# Patient Record
Sex: Female | Born: 1960
Health system: Southern US, Community
[De-identification: ages and names within clinical notes are randomized; demographics above are authoritative.]

## PROBLEM LIST (undated history)

## (undated) DIAGNOSIS — Z800 Family history of malignant neoplasm of digestive organs: Secondary | ICD-10-CM

## (undated) DIAGNOSIS — Z8041 Family history of malignant neoplasm of ovary: Secondary | ICD-10-CM

## (undated) DIAGNOSIS — J449 Chronic obstructive pulmonary disease, unspecified: Secondary | ICD-10-CM

## (undated) DIAGNOSIS — I890 Lymphedema, not elsewhere classified: Secondary | ICD-10-CM

## (undated) DIAGNOSIS — C50912 Malignant neoplasm of unspecified site of left female breast: Secondary | ICD-10-CM

## (undated) DIAGNOSIS — Z803 Family history of malignant neoplasm of breast: Secondary | ICD-10-CM

## (undated) DIAGNOSIS — E7800 Pure hypercholesterolemia, unspecified: Secondary | ICD-10-CM

## (undated) DIAGNOSIS — Z90722 Acquired absence of ovaries, bilateral: Secondary | ICD-10-CM

## (undated) DIAGNOSIS — M1990 Unspecified osteoarthritis, unspecified site: Secondary | ICD-10-CM

## (undated) DIAGNOSIS — Z853 Personal history of malignant neoplasm of breast: Secondary | ICD-10-CM

## (undated) DIAGNOSIS — C50919 Malignant neoplasm of unspecified site of unspecified female breast: Secondary | ICD-10-CM

## (undated) DIAGNOSIS — Z1509 Genetic susceptibility to other malignant neoplasm: Secondary | ICD-10-CM

## (undated) DIAGNOSIS — D649 Anemia, unspecified: Secondary | ICD-10-CM

## (undated) DIAGNOSIS — C50911 Malignant neoplasm of unspecified site of right female breast: Secondary | ICD-10-CM

## (undated) DIAGNOSIS — Z1501 Genetic susceptibility to malignant neoplasm of breast: Secondary | ICD-10-CM

## (undated) HISTORY — DX: Unspecified osteoarthritis, unspecified site: M19.90

## (undated) HISTORY — PX: ABDOMINAL HYSTERECTOMY: SHX81

## (undated) HISTORY — PX: BREAST SURGERY: SHX581

## (undated) HISTORY — DX: Acquired absence of ovaries, bilateral: Z90.722

## (undated) HISTORY — PX: MASTECTOMY: SHX3

## (undated) HISTORY — DX: Personal history of malignant neoplasm of breast: Z85.3

## (undated) HISTORY — DX: Family history of malignant neoplasm of breast: Z80.3

## (undated) HISTORY — DX: Malignant neoplasm of unspecified site of unspecified female breast: C50.919

## (undated) HISTORY — DX: Malignant neoplasm of unspecified site of left female breast: C50.912

## (undated) HISTORY — DX: Genetic susceptibility to other malignant neoplasm: Z15.09

## (undated) HISTORY — DX: Family history of malignant neoplasm of digestive organs: Z80.0

## (undated) HISTORY — DX: Malignant neoplasm of unspecified site of right female breast: C50.911

## (undated) HISTORY — DX: Anemia, unspecified: D64.9

## (undated) HISTORY — DX: Genetic susceptibility to malignant neoplasm of breast: Z15.01

## (undated) HISTORY — DX: Chronic obstructive pulmonary disease, unspecified: J44.9

## (undated) HISTORY — DX: Family history of malignant neoplasm of ovary: Z80.41

## (undated) HISTORY — PX: OTHER SURGICAL HISTORY: SHX169

---

## 2000-06-21 ENCOUNTER — Encounter: Admission: RE | Admit: 2000-06-21 | Discharge: 2000-09-19 | Payer: Self-pay | Admitting: *Deleted

## 2001-03-30 ENCOUNTER — Encounter: Admission: RE | Admit: 2001-03-30 | Discharge: 2001-03-30 | Payer: Self-pay | Admitting: Oncology

## 2001-03-30 ENCOUNTER — Encounter (HOSPITAL_COMMUNITY): Admission: RE | Admit: 2001-03-30 | Discharge: 2001-04-29 | Payer: Self-pay | Admitting: Oncology

## 2001-06-15 ENCOUNTER — Encounter: Admission: RE | Admit: 2001-06-15 | Discharge: 2001-06-15 | Payer: Self-pay | Admitting: Oncology

## 2001-06-15 ENCOUNTER — Encounter (HOSPITAL_COMMUNITY): Admission: RE | Admit: 2001-06-15 | Discharge: 2001-07-15 | Payer: Self-pay | Admitting: Oncology

## 2001-08-10 ENCOUNTER — Encounter (HOSPITAL_COMMUNITY): Admission: RE | Admit: 2001-08-10 | Discharge: 2001-09-09 | Payer: Self-pay | Admitting: Oncology

## 2001-08-10 ENCOUNTER — Encounter (HOSPITAL_COMMUNITY): Payer: Self-pay | Admitting: Oncology

## 2001-08-10 ENCOUNTER — Encounter: Admission: RE | Admit: 2001-08-10 | Discharge: 2001-08-10 | Payer: Self-pay | Admitting: Oncology

## 2001-12-12 ENCOUNTER — Encounter: Admission: RE | Admit: 2001-12-12 | Discharge: 2001-12-12 | Payer: Self-pay | Admitting: Oncology

## 2001-12-12 ENCOUNTER — Encounter (HOSPITAL_COMMUNITY): Admission: RE | Admit: 2001-12-12 | Discharge: 2002-01-11 | Payer: Self-pay | Admitting: Oncology

## 2002-04-12 ENCOUNTER — Encounter (HOSPITAL_COMMUNITY): Admission: RE | Admit: 2002-04-12 | Discharge: 2002-05-12 | Payer: Self-pay | Admitting: Oncology

## 2002-04-12 ENCOUNTER — Encounter: Admission: RE | Admit: 2002-04-12 | Discharge: 2002-04-12 | Payer: Self-pay | Admitting: Oncology

## 2003-04-25 ENCOUNTER — Encounter: Admission: RE | Admit: 2003-04-25 | Discharge: 2003-04-25 | Payer: Self-pay | Admitting: Oncology

## 2003-04-25 ENCOUNTER — Encounter (HOSPITAL_COMMUNITY): Admission: RE | Admit: 2003-04-25 | Discharge: 2003-05-25 | Payer: Self-pay | Admitting: Oncology

## 2004-04-23 ENCOUNTER — Encounter: Admission: RE | Admit: 2004-04-23 | Discharge: 2004-04-23 | Payer: Self-pay | Admitting: Oncology

## 2004-08-08 ENCOUNTER — Inpatient Hospital Stay (HOSPITAL_COMMUNITY): Admission: EM | Admit: 2004-08-08 | Discharge: 2004-08-11 | Payer: Self-pay | Admitting: Emergency Medicine

## 2005-04-20 ENCOUNTER — Encounter: Admission: RE | Admit: 2005-04-20 | Discharge: 2005-04-20 | Payer: Self-pay | Admitting: Oncology

## 2005-04-20 ENCOUNTER — Ambulatory Visit (HOSPITAL_COMMUNITY): Payer: Self-pay | Admitting: Oncology

## 2006-04-19 ENCOUNTER — Ambulatory Visit (HOSPITAL_COMMUNITY): Payer: Self-pay | Admitting: Oncology

## 2006-04-19 ENCOUNTER — Encounter: Admission: RE | Admit: 2006-04-19 | Discharge: 2006-04-19 | Payer: Self-pay | Admitting: Oncology

## 2007-01-02 ENCOUNTER — Inpatient Hospital Stay (HOSPITAL_COMMUNITY): Admission: EM | Admit: 2007-01-02 | Discharge: 2007-01-05 | Payer: Self-pay | Admitting: Emergency Medicine

## 2007-04-16 ENCOUNTER — Encounter (HOSPITAL_COMMUNITY): Admission: RE | Admit: 2007-04-16 | Discharge: 2007-05-16 | Payer: Self-pay | Admitting: Oncology

## 2007-04-16 ENCOUNTER — Ambulatory Visit (HOSPITAL_COMMUNITY): Payer: Self-pay | Admitting: Oncology

## 2008-05-20 ENCOUNTER — Ambulatory Visit (HOSPITAL_COMMUNITY): Payer: Self-pay | Admitting: Oncology

## 2008-07-28 ENCOUNTER — Other Ambulatory Visit: Admission: RE | Admit: 2008-07-28 | Discharge: 2008-07-28 | Payer: Self-pay | Admitting: Obstetrics and Gynecology

## 2008-08-01 ENCOUNTER — Ambulatory Visit (HOSPITAL_COMMUNITY): Admission: RE | Admit: 2008-08-01 | Discharge: 2008-08-01 | Payer: Self-pay | Admitting: Obstetrics & Gynecology

## 2009-06-10 ENCOUNTER — Ambulatory Visit (HOSPITAL_COMMUNITY): Payer: Self-pay | Admitting: Oncology

## 2009-06-10 ENCOUNTER — Encounter (HOSPITAL_COMMUNITY): Admission: RE | Admit: 2009-06-10 | Discharge: 2009-07-10 | Payer: Self-pay | Admitting: Oncology

## 2009-06-30 ENCOUNTER — Inpatient Hospital Stay (HOSPITAL_COMMUNITY): Admission: RE | Admit: 2009-06-30 | Discharge: 2009-07-02 | Payer: Self-pay | Admitting: Obstetrics and Gynecology

## 2009-06-30 ENCOUNTER — Encounter: Payer: Self-pay | Admitting: Obstetrics and Gynecology

## 2010-04-07 ENCOUNTER — Ambulatory Visit (HOSPITAL_COMMUNITY)
Admission: RE | Admit: 2010-04-07 | Discharge: 2010-04-07 | Payer: Self-pay | Source: Home / Self Care | Admitting: Family Medicine

## 2010-04-23 ENCOUNTER — Ambulatory Visit (HOSPITAL_COMMUNITY): Payer: Self-pay | Admitting: Oncology

## 2010-11-01 ENCOUNTER — Ambulatory Visit: Payer: Self-pay | Admitting: Internal Medicine

## 2010-12-01 ENCOUNTER — Ambulatory Visit: Admit: 2010-12-01 | Payer: Self-pay | Admitting: Internal Medicine

## 2010-12-01 ENCOUNTER — Ambulatory Visit (HOSPITAL_COMMUNITY)
Admission: RE | Admit: 2010-12-01 | Discharge: 2010-12-01 | Payer: Self-pay | Source: Home / Self Care | Attending: Internal Medicine | Admitting: Internal Medicine

## 2010-12-01 LAB — HEMOGLOBIN AND HEMATOCRIT, BLOOD
HCT: 33.9 % — ABNORMAL LOW (ref 36.0–46.0)
Hemoglobin: 11.4 g/dL — ABNORMAL LOW (ref 12.0–15.0)

## 2010-12-02 LAB — H. PYLORI ANTIBODY, IGG: H Pylori IgG: >8 {ISR} — ABNORMAL HIGH

## 2011-03-05 LAB — DIFFERENTIAL
Basophils Absolute: 0.1 10*3/uL (ref 0.0–0.1)
Basophils Relative: 1 % (ref 0–1)
Eosinophils Absolute: 0 10*3/uL (ref 0.0–0.7)
Eosinophils Relative: 0 % (ref 0–5)
Lymphocytes Relative: 20 % (ref 12–46)
Lymphs Abs: 2 10*3/uL (ref 0.7–4.0)
Monocytes Absolute: 0.3 10*3/uL (ref 0.1–1.0)
Monocytes Relative: 3 % (ref 3–12)
Neutro Abs: 8 10*3/uL — ABNORMAL HIGH (ref 1.7–7.7)
Neutrophils Relative %: 77 % (ref 43–77)

## 2011-03-05 LAB — CBC
HCT: 35.2 % — ABNORMAL LOW (ref 36.0–46.0)
Hemoglobin: 11.6 g/dL — ABNORMAL LOW (ref 12.0–15.0)
MCHC: 33.1 g/dL (ref 30.0–36.0)
MCV: 78.9 fL (ref 78.0–100.0)
Platelets: 215 10*3/uL (ref 150–400)
RBC: 4.46 MIL/uL (ref 3.87–5.11)
RDW: 15.6 % — ABNORMAL HIGH (ref 11.5–15.5)
WBC: 10.4 10*3/uL (ref 4.0–10.5)

## 2011-03-06 LAB — CA 125: CA 125: 18.4 U/mL (ref 0.0–30.2)

## 2011-03-06 LAB — CBC
HCT: 37.8 % (ref 36.0–46.0)
Hemoglobin: 12.7 g/dL (ref 12.0–15.0)
MCHC: 33.5 g/dL (ref 30.0–36.0)
MCV: 78.6 fL (ref 78.0–100.0)
Platelets: 245 10*3/uL (ref 150–400)
RBC: 4.81 MIL/uL (ref 3.87–5.11)
RDW: 15.7 % — ABNORMAL HIGH (ref 11.5–15.5)
WBC: 3.6 10*3/uL — ABNORMAL LOW (ref 4.0–10.5)

## 2011-03-06 LAB — COMPREHENSIVE METABOLIC PANEL
ALT: 21 U/L (ref 0–35)
AST: 22 U/L (ref 0–37)
Albumin: 3.7 g/dL (ref 3.5–5.2)
Alkaline Phosphatase: 35 U/L — ABNORMAL LOW (ref 39–117)
BUN: 7 mg/dL (ref 6–23)
CO2: 28 mEq/L (ref 19–32)
Calcium: 9.7 mg/dL (ref 8.4–10.5)
Chloride: 104 mEq/L (ref 96–112)
Creatinine, Ser: 0.76 mg/dL (ref 0.4–1.2)
GFR calc Af Amer: >60 mL/min (ref >60)
GFR calc non Af Amer: >60 mL/min (ref >60)
Glucose, Bld: 100 mg/dL — ABNORMAL HIGH (ref 70–99)
Potassium: 4.2 mEq/L (ref 3.5–5.1)
Sodium: 138 mEq/L (ref 135–145)
Total Bilirubin: 0.8 mg/dL (ref 0.3–1.2)
Total Protein: 7.2 g/dL (ref 6.0–8.3)

## 2011-03-06 LAB — TYPE AND SCREEN
ABO/RH(D): B POS
Antibody Screen: NEGATIVE

## 2011-03-06 LAB — HCG, SERUM, QUALITATIVE: Preg, Serum: NEGATIVE

## 2011-04-12 NOTE — Discharge Summary (Signed)
NAMEANGELISA, [name]                ACCOUNT NO.:  [id number]   MEDICAL RECORD NO.:  [id number]          PATIENT TYPE:  INP   LOCATION:  A319                          FACILITY:  [facility]   PHYSICIAN:  [name], M.D. DATE OF BIRTH:  11-20-1961   DATE OF ADMISSION:  06/30/2009  DATE OF DISCHARGE:  08/05/2010LH                               DISCHARGE SUMMARY   ADMITTING DIAGNOSES:  1. Uterine fibroids, 14 weeks' size.  2. Breast cancer, increased risk, suspected BRCA1 carrier.  3. Asthma stable.   DISCHARGE DIAGNOSES:  1. Uterine fibroids, 14 weeks' size.  2. Breast cancer, increased risk, suspected BRCA1 carrier.  3. Adenomyosis.  4. Asthma stable.   PROCEDURE:  Total abdominal hysterectomy and bilateral salpingo-  oophorectomy.   DISCHARGE MEDICATIONS:  Current medicines continued:  1. Advair 1 puff twice daily.  2. ProAir 2 puffs p.o. daily.  3. Tylenol 325 mg, 2 tabs p.o. q.4 h. p.r.n. pain.  4. Multivitamin 1 p.o. daily.   Additional medications:  1. Percocet 5/325, 30 tablets 1 p.o. q.6 h. p.r.n. severe pain.  2. Motrin 600 mg, 30, 1 p.o. q.6 h. p.r.n. mild pain and discomfort.  3. Colace 100 mg p.o. b.i.d.   HOSPITAL SUMMARY:  This 49 year old female referred by Dr. Mariel Estrada,  her oncologist, for pelvic mass identified during routine visit, was  referred to our office due to the mass effect and for removal of ovaries  to reduce ovarian cancer risk, given which she has an extensive history  of personal breast cancer as well as family history.  The patient  underwent admission with hemoglobin 12.7, hematocrit 37.8, BUN 7, and  creatinine 0.6.  Blood type A positive.  She underwent total abdominal  hysterectomy and bilateral salpingo-oophorectomy via  Pfannenstiel  incision on day of admission with 300 mL estimated blood loss.  Pathology report returned showed a 14.5 x 11 x 8.5 cm uterine body and  5.5 cm long cervix, total weight of 626 g.  Pathology report showed  benign ovaries, uterine fibroids, and adenomyosis.  Also, the pathology  showed benign scar tissue where the old cicatrix was removed.  Postop  course was uneventful with the patient having postoperative hemoglobin  11.6 and hematocrit 35.2.  She had normal blood pressures in the 130  systolic immediately postop day #1 and then reducing to 80-90 systolic  on day #2  was seen, management was optimized.  She had an uneventful postop course  and discharged postop day #2 for followup in 5 days for incision check.  Routine postsurgical instructions given including monitoring  temperature, resumption of stool softener x30 days, and follow up p.r.n.  at our office.      [name], M.D.  Electronically Signed     JVF/MEDQ  D:  07/02/2009  T:  07/02/2009  Job:  [id number]   cc:   [name]. Kellie Sleet, MD  Fax: [phone]

## 2011-04-12 NOTE — H&P (Signed)
[name], [name]                ACCOUNT NO.:  [id number]   MEDICAL RECORD NO.:  [id number]          PATIENT TYPE:  AMB   LOCATION:  DAY                           FACILITY:  [facility]   PHYSICIAN:  [name], M.D. DATE OF BIRTH:  February 07, 1961   DATE OF ADMISSION:  06/30/2009  DATE OF DISCHARGE:  LH                              HISTORY & PHYSICAL   ADMISSION DIAGNOSIS:  1. Uterine fibroids, 16-18 weeks' size.  2. Probable breast cancer antigen 1 carrier.  3. History of bilateral breast cancers, status post chemotherapy.  4. Bilateral lymphedema.   HISTORY OF PRESENT ILLNESS:  This 50 year old female is admitted for TAH-  BSO probably, and may require midline vertical incision for this  procedure.  She has been seen in our office and it has been noted to  have uterine enlargement with fibroids, estimated approximately [phone]  g uterus.  The ovaries are grossly normal.  She had a history bilateral  breast cancer and has an extensive family history of it.  She has been  recommended to have bilateral oophorectomy by her oncologist, Dr.  Mariel Sleet.  Plans are to proceed with abdominal hysterectomy and  bilateral salpingo-oophorectomy.  Recent Pap smear is performed and was  normal.  GC and chlamydia culture was negative.  Endometrial biopsy has  been performed, which shows secretory endometrium with chronic  endometriosis and focal stromal breakdown she was at the end of it.  There was no endometrial hyperplasia, atypia, or malignancy.   PAST MEDICAL HISTORY:   ALLERGIES:  None.   MEDICATIONS:  Asthma inhaler p.r.n. and occasional Advil.   PERSONAL HISTORY:  Positive for breast cancer, asthma, hypertension, and  cervical disk changes, bilateral lymphedema, and status post mastectomy  and treatments including radiation therapy.   SURGICAL HISTORY:  Right partial mastectomy in 1992, left partial  mastectomy in October 2001.   SOCIAL HABITS:  Nonsmoker and nondrinker.  No drug.   Homemaker.   PHYSICAL EXAMINATION:  VITAL SIGNS:  Height 5 and 1-1/2 inches, weight  179.4, blood pressure 120/80, and pulse is 80.  Urinalysis is negative.  GENERAL:  A healthy-appearing large framed African American female alert  and oriented x3.  HEENT:  Pupils are equal, round, and reactive.  NECK:  Supple.  Normal thyroid.  BREASTS:  Deferred at this time with recent evaluation by Dr. Mariel Sleet.  CARDIAC:  Regular rate and rhythm without murmurs, rubs, or gallops.  ABDOMEN:  Nontender without masses.  EXTREMITIES:  Bilateral lymphedema, IV access was considered to be a  challenge.  She has had a subclavian line in the past, which has been  taken out.  EXTERNAL GENITALIA:  Normal cervix.  Vaginal exam normal.  Cervical exam  shows purulent normal tissues.  Pap smear was normal on July 07 2008.  Adnexa nontender.  A 16-18 week size uterus with mobile and good support  with lower abdomen incision transverse due to prior cesarean section.   PLAN:  Abdominal hysterectomy and bilateral salpingo-oophorectomy on  June 30, 2009 7:30 a.m. at [facility].  IV antibiotic  prophylaxis and  Flowtron leg massage and antiembolic prophylaxis  planned.      [name], M.D.  Electronically Signed     JVF/MEDQ  D:  06/29/2009  T:  06/30/2009  Job:  [id number]

## 2011-04-12 NOTE — Op Note (Signed)
[name], [name]                ACCOUNT NO.:  [id number]   MEDICAL RECORD NO.:  [id number]          PATIENT TYPE:  INP   LOCATION:  A319                          FACILITY:  [facility]   PHYSICIAN:  [name], M.D. DATE OF BIRTH:  [birth year]   DATE OF PROCEDURE:  06/30/2009  DATE OF DISCHARGE:                               OPERATIVE REPORT   PREOPERATIVE DIAGNOSIS:  Uterine fibroids 14-16 weeks size.   POSTOPERATIVE DIAGNOSES:  Uterine fibroids 14-16 weeks size, increased  risk of breast cancer, suspected the breast cancer antigen 1 carrier   PROCEDURE:  Total abdominal hysterectomy, bilateral salpingo-  oophorectomy.   SURGEON:  Kellie Burrow, MD   ASSISTANT:  [name], registered nurse.   ANESTHESIA:  General, [name]   COMPLICATIONS:  None.   FINDINGS:  Multiple fibroids, short right round ligament, multiple  varicosities bilaterally, normal-appearing ovaries bilaterally.   INDICATIONS:  A 50 year old female referred for pelvic mass found to  have a large fibroids which were source of discomfort.  Additionally,  the patient has had bilateral breast cancer and extensive family history  of breast cancer which I suspected to have the BRCA 1 carrier status.   DETAILS OF PROCEDURE:  The patient was taken to the operating room,  prepped and draped for lower abdominal surgery.  IV access was through  in superficial vein in the right shoulder region due to bilateral  lymphedema on the upper extremities.  Status post prior to chemotherapy.  Time-out was conducted.  Procedure confirmed by all parties involved in  antibiotics administration prior to incision confirmed.  Pfannenstiel  type-incision was repeated with excision of the old cicatrix through the  skin and subcu tissues.  Fascia was opened transversely and elevated off  the underlying muscle with some difficulty due to fibrosis and the  peritoneal cavity carefully entered in the midline and opened cephalad  and  caudad.  Bowel was elevated out of the pelvis and a flexible  retractor put in place.  Uterus was mobile sufficiently to allow access  to the round ligaments for leaving the uterus in situ.  Round ligaments  were taken down bilaterally.  On the left side, the access to the  infundibulopelvic ligament was technically challenging so the first  portion of the procedure was taken down the left utero-ovarian ligament  and associated connective tissue with double clamping with Kelly clamps  transection and 0 chromic suture ligature.  On the right side, the round  ligament was taken down and subsequently the right infundibulopelvic  ligament could be identified and isolated, clamped, cut, and suture-  ligated using Kelly clamps and 0 chromic suture ligature.  I stayed  close to the ovary and therefore were able to avoid the rest of the  ureter on this side.  The uterine vessels were then isolated on either  side.  The bladder flap was developed with some technical difficulty due  to rather dense adhesions to the lower uterine segment site of the prior  cesarean sections.  We carefully were able to tunnel beneath the  fibrosis and dissect the  vesicouterine space and then peel the edge of  the bladder flap off the lower uterine segment without further  difficulty.   Uterine vessels were then isolated on either side, clamped with curved  Heaney clamps x2, and a Kelly clamp for backbleeding transected and  suture-ligated.  At this time, the uterine body was amputated off the  lower uterine segment and sent off as specimen.  The improved access  allow the grasping of the lower uterine segment.  We marched down the  sides of the lower uterine segment clamping and cutting and suture  ligating with straight Heaney clamps, knife dissection, and 0 chromic  suture ligature.  Bladder flap peeled down nicely.  Upon reaching the  level with the cervix could be palpated through the upper vagina, a  single  stab incision was made into the anterior cervicovaginal fornix  and the cervix amputated carefully off the vaginal cuff.  Four Kocher  clamps were used to grasp the vaginal cuff edges and Aldridge stitches  were placed at each lateral vaginal angle.  This allowed good vaginal  cuff support.  The remainder of the cuff was closed with a series of  interrupted sutures of 0 chromic.  Hemostasis was satisfactory.  Point  cautery was required on the posterior aspect of the cuff x2 sites.  Irrigation of the pelvis was performed.   The left tube and ovary was then grasped, placed on traction of the  retroperitoneum, and dissected sufficiently to palpate the left ureter  as being well away from the area of surgical endeavor and the left IP  ligament was clamped, cut, and suture-ligated without difficulty with  good hemostasis.  Pelvis was further irrigated and inspected.  Adequate  hemostasis considered at present.  Laparotomy equipment was removed, and  EBL estimated 300 mL.  Sponge and needle counts were correct.  The  anterior peritoneum was closed with running 2-0 chromic and the fascia  then closed with running 0 Vicryl beginning at each side and sewing to  the midline, 2 separate portions of suture.  Subcu tissues were  irrigated and inspected.  Point cautery used as necessary to confirm  hemostasis and then interrupted 2-0 plain sutures placed in the subcu  space to obliterate the potential space and staple closure of the skin  completed the procedure.  Sponge and needle counts were correct.   ESTIMATED BLOOD LOSS:  300 mL.      [name], M.D.  Electronically Signed     JVF/MEDQ  D:  06/30/2009  T:  06/30/2009  Job:  [id number]   cc:   [name]. Kellie Sleet, MD  Fax: [phone]

## 2011-04-12 NOTE — H&P (Signed)
NAME:  [name], [name]                ACCOUNT NO.:  [id number]   MEDICAL RECORD NO.:  [id number]          PATIENT TYPE:  AMB   LOCATION:  DAY                           FACILITY:  [facility]   PHYSICIAN:  [name], M.D. DATE OF BIRTH:  [birth year]   DATE OF ADMISSION:  DATE OF DISCHARGE:  LH                              HISTORY & PHYSICAL   ADMISSION DIAGNOSES:  1. Uterine fibroids 16-18 weeks' size.  2. Breast cancer gene 1, carrier status (probable).  3. History of bilateral breast cancer.  4. Bilateral lymphedema status post axillary dissection bilaterally      and radiation therapy bilaterally.   HISTORY OF PRESENT ILLNESS:  This 50 year old perimenopausal female  still having menses on a regular basis is seeing at this time for total  abdominal hysterectomy and bilateral salpingo-oophorectomy due to  symptomatic pressure from the large uterine fibroids measuring 16-18  weeks' size as well as bilateral salpingo-oophorectomy due to increased  risk of ovarian and/or recurrent breast cancer due to presumed BRCA1  carrier status.  The patient's mother is known as BRCA1 carrier and the  patient has had bilateral breast cancers, both in 1992 on the right, and  in 2001 on the left.  She has been advised by Dr. Mariel Sleet, her medical  oncologist to proceed with hysterectomy and removal of ovaries and at  this time, has decided to proceed with this due to symptoms of the  enlarged uterine fibroids.  She has had an ultrasound at [facility] confirming that it is uterine fibroid.  She has had a 1.5-cm  thickened endometrial complex on June 10, 2009, and is due.  Plans are  to proceed toward hysterectomy.  She has had a normal Pap smear within  the past year.   PAST MEDICAL HISTORY:  Notable for no allergies known.   MEDICATIONS:  Inhaler for asthma, occasional Advil.   PERSONAL HISTORY:  Positive for bilateral breast cancer, essential  hypertension, cervical disk changes, and  bilateral lymphedema in both  arms status post mastectomy, lymph node sampling, and radiation therapy.   SURGICAL HISTORY:  Right mastectomy, 1992; left mastectomy, 2001;  cesarean section x2, 1990 and subsequently.   PHYSICAL EXAMINATION:  GENERAL:  Healthy-appearing African American  female, moderately overweight with obvious lymphedema of both upper  extremities.  HEENT:  Pupils are equal, round, and reactive.  Extraocular movements  intact.  NECK:  Supple.  BREASTS:  Deferred.  Recent exam by Dr. Mariel Sleet this month.  CARDIAC:  Regular rate and rhythm without murmurs.  ABDOMEN:  Mass noted just below the umbilicus 16-18 weeks' size with  well-healed Pfannenstiel-type incision.  MUSCULOSKELETAL:  Normal.  EXTREMITIES:  Notable for the bilateral lymphedema.  She states that IV  access is difficult.  She has had a subclavian PICC line, which has been  removed in the past, both placed and removed by Dr. Elpidio Anis.  GU:  External genitalia is normal.  Vaginal exam visibly normal.  Cervix  is normal.  Pap smears performed, June 15, 2009.  Uterus is anteflexed,  16-18 weeks' size,  mobile, elongated with good pelvic support.   IMPRESSION:  Uterine fibroids 16-18 weeks' size, mobile pelvis.   PLAN:  Total abdominal hysterectomy, bilateral salpingo-oophorectomy,  June 30, 2009, 7:30 a.m.      [name], M.D.  Electronically Signed     JVF/MEDQ  D:  06/22/2009  T:  06/23/2009  Job:  [id number]   cc:   [facility]

## 2011-04-15 NOTE — H&P (Signed)
Kellie, Estrada                ACCOUNT NO.:  [id number]   MEDICAL RECORD NO.:  [id number]          PATIENT TYPE:  INP   LOCATION:  A325                          FACILITY:  [facility]   PHYSICIAN:  Jerolyn Shin C. Katrinka Blazing, M.D.   DATE OF BIRTH:  1961-02-20   DATE OF ADMISSION:  08/08/2004  DATE OF DISCHARGE:  09/14/2005LH                                HISTORY & PHYSICAL   HISTORY:  A 50 year old female with a history of chronic lymphedema, who has  a three to four-day history of progressive swelling of her left arm.  She  tried warm compresses and elevation at home, but the swelling did not get  better.  She came to the emergency room for evaluation where she was found  to have extensive cellulitis of her left arm.  With the history of severe  lymphedema, the patient was admitted and will undergo IV antibiotics and  local wound care.   PAST HISTORY:  1.  She has a history of bilateral breast cancer and is status post      bilateral modified radical mastectomy with axillary node dissection.  2.  She  has had a chronic lymphedema for at least four years.  3.  She also has bronchial asthma.   MEDICATIONS:  1.  Advair inhaler twice daily.  2.  Albuterol nebulizer q.i.d. p.r.n.   ALLERGIES:  She has no known drug allergies.   SOCIAL HISTORY:  She is medically retired.  She lives with her husband and  three children.  She does not drink, smoke or use drugs.   FAMILY PHYSICIAN:  Mila Homer. [name], M.D.   REVIEW OF SYSTEMS:  Unremarkable, except for intermittent shortness of  breath, chest tightness and chronic swelling of her arms.   PHYSICAL EXAMINATION:  GENERAL APPEARANCE:  The patient is in no acute  distress.  VITAL SIGNS:  Her blood pressure is 110/70, pulse 80, respirations 18 and  temperature 98.4 degrees.  HEENT:  Unremarkable.  NECK:  Supple.  No JVD or bruit.  LUNGS:  Clear to auscultation.  No rales, rubs, rhonchi or wheezes.  BREASTS:  Surgically absent bilaterally.  CHEST:  The chest wall is unremarkable.  Surgical scars are healed.  AXILLAE:  Unremarkable.  There is no palpable lymphadenopathy.  HEART:  Regular rate and rhythm without murmur, rub or gallop.  ABDOMEN:  Obese, soft and nontender.  No masses.  EXTREMITIES:  Diffuse swelling of both extremities, including hands and  fingers up to shoulders.  There is diffuse swelling and redness of the left  hand with tenderness only with palpation.  The erythema blanches with  palpation.  There is no evidence of desquamation of the skin.  The lower  extremities are unremarkable.  NEUROLOGIC:  No focal, motor, sensory or cerebellar deficits.   IMPRESSION:  1.  Cellulitis of the left arm with a history of chronic lymphedema.  2.  Bilateral lymphedema.  3.  History of bilateral breast cancer, status post modified radical      mastectomy with node dissection and status post chemotherapy.  4.  Chronic bronchial asthma,  stable.   PLAN:  The patient was admitted.  She will be treated with IV antibiotics,  local wound care and elevation.  Baseline laboratories will be drawn.     Lero   LCS/MEDQ  D:  10/17/2004  T:  10/17/2004  Job:  [id number]

## 2011-04-15 NOTE — Group Therapy Note (Signed)
NAMEDARBIE, BIANCARDI                ACCOUNT NO.:  [id number]   MEDICAL RECORD NO.:  [id number]          PATIENT TYPE:  INP   LOCATION:  A307                          FACILITY:  [facility]   PHYSICIAN:  Mila Homer. [name], M.D.DATE OF BIRTH:  1961/05/22   DATE OF PROCEDURE:  DATE OF DISCHARGE:                                 PROGRESS NOTE   Says she feels better.  Still has some swelling and tenderness in the  arm but is improved since yesterday.   OBJECTIVE:  Temperature is 98.0, pulse 77, respiratory rate 20, blood  pressure 100/73.  She is sitting up bed.  She is oriented, alert and in no acute distress.  Well-developed, well-nourished.  She has got bilateral lymphedema of both arms but she is able to flex  and extend the right arm better than yesterday and seems to have less  pain with it.  O2 sat is 97% room air.   ASSESSMENT:  1. Cellulitis of the right arm.  2. Bilateral lymphedema.  3. Status post bilateral breast cancer first in 1992, the second in      2000 with bilateral modified radical mastectomies.   PLAN:  Continue IV antibiotics.  Discontinue  IVs, switch to Hep-Lock.  Hopefully go home tomorrow.      Mila Homer. [name], M.D.  Electronically Signed     SDK/MEDQ  D:  01/04/2007  T:  01/04/2007  Job:  [id number]

## 2011-04-15 NOTE — Discharge Summary (Signed)
NAMEQUEENIE, AUFIERO                ACCOUNT NO.:  [id number]   MEDICAL RECORD NO.:  [id number]          PATIENT TYPE:  INP   LOCATION:  A325                          FACILITY:  [facility]   PHYSICIAN:  Jerolyn Shin C. Katrinka Blazing, M.D.   DATE OF BIRTH:  Feb 21, 1961   DATE OF ADMISSION:  08/08/2004  DATE OF DISCHARGE:  09/14/2005LH                                 DISCHARGE SUMMARY   DISCHARGE DIAGNOSES:  1.  Acute cellulitis and lymphangitis left arm.  2.  Chronic bilateral upper extremity lymphedema.  3.  History of bilateral breast cancer status post bilateral modified      radical mastectomy.  4.  Chronic bronchial asthma.   DISPOSITION:  Patient is discharged home in stable satisfactory condition.   DISCHARGE MEDICATIONS:  1.  Omnicef 300 mg b.i.d.  2.  Advair 150 one inhalation b.i.d.  3.  Tylenol 500 mg q.6-8h. p.r.n.   Patient is scheduled to be seen in the office one week post discharge.   SUMMARY:  A 50 year old female with history of a bilateral chronic  lymphedema status post bilateral modified radical mastectomy with node  dissection.  The patient presented with a few day history of redness and  swelling of her left forearm which progressed to involve her entire arm.  She had extensive cellulitis and lymphangitis of the arm.  She was afebrile.  Examination was unremarkable.  Patient was admitted and started on Unasyn  and Cleocin.  She had warm compresses to her left upper extremity and her  extremity was elevated.  She was continued on her baseline medications.  She  responded appropriately to treatment.  The edema reduced significantly.  She  had much less discomfort.  She did not have a leukocytosis.  She was never  afebrile.  By the 14th of September she was doing well.  The swelling was  about 70% reduced and was actually smaller than her opposite side.  She was  afebrile.  The patient was discharged home much improved in stable,  satisfactory condition.     Lero   LCS/MEDQ   D:  10/17/2004  T:  10/17/2004  Job:  [id number]

## 2011-04-15 NOTE — Discharge Summary (Signed)
[name], [name]                ACCOUNT NO.:  [id number]   MEDICAL RECORD NO.:  [id number]          PATIENT TYPE:  INP   LOCATION:  A307                          FACILITY:  [facility]   PHYSICIAN:  Mila Homer. [name], M.D.DATE OF BIRTH:  [birth year]   DATE OF ADMISSION:  01/02/2007  DATE OF DISCHARGE:  02/08/2008LH                               DISCHARGE SUMMARY   This 50 year old woman was admitted to the hospital with cellulitis of  the right arm.  She had a benign 3-day hospitalization extending from  January 03, 2007 to January 05, 2007.  Vital signs remain stable.   Her admission white cell count was 8400 of which 67% were neutrophils,  24 lymphs, H&H 11.2 and 35, MCV of 77.6.  Her BMP was normal and her BNP  less than 30.  Blood cultures x2 showed no growth at 3 days.   She was started on an IV 1/2 normal saline at 50 mL an hour and Unasyn  1.5 g IV q.6h., Lortab 5/500 q.6h. for pain.  She was continued on  Advair Diskus 5/250 puff b.i.d., albuterol 5 mg neb treatments q.4h. for  wheezing.   IV fluids were increased to 100 mL an hour the following day and she was  given Zofran 4 mg IV four hours p.r.n. nausea and started in ibuprofen  800 mg q.8 hours.  It turned out that the nausea was secondary to  hydrocodone and once hydrocodone was stopped that cleared.  Advair  Diskus was changed to 50/100 puff b.i.d. and by her second day the IV  was dc'd, she was put on Hep-Lock, up in a chair and she is ready for  discharge home the following day.   FINAL DISCHARGE DIAGNOSES:  1. Cellulitis of the right arm.  2. Bilateral lymphedema of the arms secondary to bilateral modified      radical mastectomies.  3. Breast cancer in the right breast with diagnosis and treatment      1992.  4. Breast cancer of the left breast with diagnosis and treatment in      2000.  5. Asthma, stable.  6. Obesity.   The patient will be discharged home on Augmentin 875 b.i.d. for 10 days  (20, no  refills).  She will continue with her Advair Diskus b.i.d. and  p.r.n. albuterol.  She will use ibuprofen 800 mg t.i.d. for pain (30, no  refill).  Follow up within the office in 3 days.      Mila Homer. [name], M.D.  Electronically Signed     SDK/MEDQ  D:  01/05/2007  T:  01/05/2007  Job:  [id number]

## 2011-04-15 NOTE — H&P (Signed)
[name], [name]                ACCOUNT NO.:  [id number]   MEDICAL RECORD NO.:  [id number]          PATIENT TYPE:  INP   LOCATION:  A307                          FACILITY:  [facility]   PHYSICIAN:  Mila Homer. [name], M.D.DATE OF BIRTH:  [birth year]   DATE OF ADMISSION:  01/02/2007  DATE OF DISCHARGE:  LH                              HISTORY & PHYSICAL   This is a 50 year old who developed swelling and pain in the right arm  starting the day prior to admission.  She had been fine the day before.   During the day she developed more pain and then some erythema of the  right forearm and increased swelling and pain and finally by her second  day, she present to the ED for evaluation.  She had called our office  late that afternoon and we had referred her to the ED given her history.   She had a right modified radical mastectomy in 1992 and a left modified  radical mastectomy in 2000.  She has had cellulitis of her left arm on  one occasion.  At that time she had so much swelling that she required  soaks and hospitalization.  Her breast cancer has been stable, however.   She also has a history of asthma for which she is on medication.  This  has been stable as well.   The patient denies any chest pains or shortness of breath.  She has had  no nausea, although after receiving hydrocodone on two occasion in the  hospital so far this admission, she has had nausea and vomiting.   MEDICATIONS:  Advair discus puff b.i.d., albuterol p.r.n. and ibuprofen  p.r.n.   She has had chronic lymphedema of both arms since her surgery.   REVIEW OF SYSTEMS:  Otherwise normal.  She has had no cardiac,  pulmonary, GI and GU symptomatology.  There have been no neurological  problems.   PHYSICAL EXAMINATION ON ADMISSION:  VITAL SIGNS:  Temperature 98.7,  pulse 84, respirations 16, blood pressure 129/74.  O2 sat was 98% on  room air.  GENERAL APPEARANCE:  She was well-developed and obese in acute  distress  due to the right arm pain, alert and oriented.  BREASTS:  She has bilateral modified radical mastectomies.  EXTREMITIES:  She has swelling in both arms and she had tenderness in  the right arm and enough swelling so she could not fully flex the right  arm at the right elbow.  There is no edema of the ankles.  HEART:  Regular rhythm and rate of 70.  LUNGS:  Clear throughout, moving air well.  ABDOMEN:  Soft without hepatosplenomegaly or mass but she did show  obesity.   Her admission blood work showed white cell count 8400 of which 67% were  neutrophils, 24 lymphs.  Her H&H was 11.2 and 35, MCV of 77, platelet  count is 301,000.  BMP was normal.  Beta natriuretic peptide less than  30.  Two blood cultures are showing no growth at 1 day.   ADMISSION DIAGNOSES:  1. Cellulitis of the right arm  2. Chronic bilateral lymphedema of the arm secondary to bilateral      modified radical mastectomies.  3. Breast cancer of the right breast with diagnosis and treatment in      1992.  4. Breast cancer of the left breast with diagnosis and treatment in      2000.  5. Asthma which has been stable.  6. Obesity.   The patient admitted to the hospital, IV half normal saline at 50 mL an  hour.  She is continue her Advair Diskus 50/250 puff  b.i.d., albuterol  nebulizer treatments q.4h. p.r.n. wheezing and to be on Unasyn 1.5 g IV  q.6h.  She has developed nausea with the Lortab so she will just be  treated with ibuprofen 800 mg p.o. q.8h. and Zofran 4 mg IV q.4h. for  any further nausea.      Mila Homer. [name], M.D.  Electronically Signed     SDK/MEDQ  D:  01/03/2007  T:  01/03/2007  Job:  [id number]

## 2011-04-22 ENCOUNTER — Encounter (HOSPITAL_COMMUNITY): Payer: 59 | Attending: Oncology | Admitting: Oncology

## 2011-04-22 DIAGNOSIS — C50919 Malignant neoplasm of unspecified site of unspecified female breast: Secondary | ICD-10-CM

## 2012-04-20 ENCOUNTER — Encounter (HOSPITAL_COMMUNITY): Payer: Self-pay | Admitting: Oncology

## 2012-04-20 ENCOUNTER — Encounter (HOSPITAL_COMMUNITY): Payer: 59 | Attending: Oncology | Admitting: Oncology

## 2012-04-20 VITALS — BP 111/69 | HR 76 | Temp 98.1°F | Wt 174.6 lb

## 2012-04-20 DIAGNOSIS — J45909 Unspecified asthma, uncomplicated: Secondary | ICD-10-CM | POA: Insufficient documentation

## 2012-04-20 DIAGNOSIS — Z09 Encounter for follow-up examination after completed treatment for conditions other than malignant neoplasm: Secondary | ICD-10-CM | POA: Insufficient documentation

## 2012-04-20 DIAGNOSIS — E669 Obesity, unspecified: Secondary | ICD-10-CM | POA: Insufficient documentation

## 2012-04-20 DIAGNOSIS — K5900 Constipation, unspecified: Secondary | ICD-10-CM | POA: Insufficient documentation

## 2012-04-20 DIAGNOSIS — K7689 Other specified diseases of liver: Secondary | ICD-10-CM | POA: Insufficient documentation

## 2012-04-20 DIAGNOSIS — Z853 Personal history of malignant neoplasm of breast: Secondary | ICD-10-CM

## 2012-04-20 DIAGNOSIS — M069 Rheumatoid arthritis, unspecified: Secondary | ICD-10-CM | POA: Insufficient documentation

## 2012-04-20 DIAGNOSIS — Z803 Family history of malignant neoplasm of breast: Secondary | ICD-10-CM | POA: Insufficient documentation

## 2012-04-20 DIAGNOSIS — C50919 Malignant neoplasm of unspecified site of unspecified female breast: Secondary | ICD-10-CM

## 2012-04-20 LAB — IRON AND TIBC
Iron: 104 ug/dL (ref 42–135)
Saturation Ratios: 34 % (ref 20–55)
TIBC: 308 ug/dL (ref 250–470)
UIBC: 204 ug/dL (ref 125–400)

## 2012-04-20 LAB — COMPREHENSIVE METABOLIC PANEL
Albumin: 3.9 g/dL (ref 3.5–5.2)
Alkaline Phosphatase: 58 U/L (ref 39–117)
BUN: 8 mg/dL (ref 6–23)
Calcium: 10.1 mg/dL (ref 8.4–10.5)
Creatinine, Ser: 0.63 mg/dL (ref 0.50–1.10)
GFR calc Af Amer: >90 mL/min (ref >90)
Potassium: 3.9 mEq/L (ref 3.5–5.1)
Total Protein: 8.1 g/dL (ref 6.0–8.3)

## 2012-04-20 LAB — CBC
HCT: 39.1 % (ref 36.0–46.0)
Hemoglobin: 12.6 g/dL (ref 12.0–15.0)
MCH: 25.3 pg — ABNORMAL LOW (ref 26.0–34.0)
MCHC: 32.2 g/dL (ref 30.0–36.0)
MCV: 78.5 fL (ref 78.0–100.0)
Platelets: 197 10*3/uL (ref 150–400)
RBC: 4.98 MIL/uL (ref 3.87–5.11)
RDW: 15.6 % — ABNORMAL HIGH (ref 11.5–15.5)
WBC: 2.1 10*3/uL — ABNORMAL LOW (ref 4.0–10.5)

## 2012-04-20 LAB — DIFFERENTIAL
Basophils Relative: 1 % (ref 0–1)
Eosinophils Absolute: 0.2 10*3/uL (ref 0.0–0.7)
Eosinophils Relative: 9 % — ABNORMAL HIGH (ref 0–5)
Monocytes Relative: 9 % (ref 3–12)
Neutrophils Relative %: 32 % — ABNORMAL LOW (ref 43–77)

## 2012-04-20 LAB — FERRITIN: Ferritin: 323 ng/mL — ABNORMAL HIGH (ref 10–291)

## 2012-04-20 NOTE — Patient Instructions (Signed)
[name]  [id number] [birth year] Dr. Glenford Peers   [facility]  Discharge Instructions  RECOMMENDATIONS MADE BY THE CONSULTANT AND ANY TEST RESULTS WILL BE SENT TO YOUR REFERRING DOCTOR.   EXAM FINDINGS BY MD TODAY AND SIGNS AND SYMPTOMS TO REPORT TO CLINIC OR PRIMARY MD: Exam and discussion per MD.  Report any new lumps, bone pain or shortness of breath.  MEDICATIONS PRESCRIBED: none     SPECIAL INSTRUCTIONS/FOLLOW-UP: Lab work Needed today, Xray Studies Needed Bone Density next week and Return to Clinic in 1 year.   I acknowledge that I have been informed and understand all the instructions given to me and received a copy. I do not have any more questions at this time, but understand that I may call the [facility] at [facility] at [phone] during business hours should I have any further questions or need assistance in obtaining follow-up care.    __________________________________________  _____________  __________ Signature of Patient or Authorized Representative            Date                   Time    __________________________________________ Nurse's Signature

## 2012-04-20 NOTE — Progress Notes (Signed)
CC:   [name]. [name], M.D. [name], M.D.  DIAGNOSES: 1. Stage III adenocarcinoma of the left breast 9.5 cm in size with     13/24 positive nodes, focal extracapsular extension in several     nodes with LVI.  Several nodes were greater than 3 cm.  She was     treated with surgery on 12/03/1999 followed by [facility] followed by Taxol     and remains disease free.  Her cancer was triple negative. 2. Right-sided breast cancer stage IIA September 1993 with a T2 N0 M0     lesion treated with mastectomy followed by CIF for 6 cycles.  ER     receptors were 67fm/mg cytosol protein.  PR receptors 23.  Cancer was diploid.     S-phase fraction was very low at 0.4%.  12 nodes were negative.     She was treated with tamoxifen after the chemotherapy. 3. BRAC1 positivity in her mother who has had breast cancer.  Another     sister died of breast cancer at 80.  She has another sister who has     had breast cancer, may have recurrent disease now who is 50. 4. Left arm cellulitis in the past. 5. Rheumatoid arthritis on methotrexate and folic acid.  She is also     taking iron daily which is constipating her.  I am not really sure     if she needs it.  Will check her iron studies today.  If we can, I     have asked her to reduce her iron to just one 3 times a week until     she hears from [place] otherwise.  She is not menstruating.  She is not     losing blood in her GI tract, etc., so I do not think she will need     as much on iron. 6. Obesity. 7. Longstanding asthma. 8. Sclerotic focus in 1 of her lower vertebral bodies, stable since     1993, and I am not going to reimage that. 9. Small benign liver lesion 9 mm in size, and that has also been     stable for many years.  I will not reimage that. 10.Hysterectomy by Dr. Emelda Estrada for uterine fibroid on 06/30/2009.     That was a complete hysterectomy.  Pathology was negative. 11.Colonoscopy January 2012 by Dr. Karilyn Estrada.  [name] was here in May of last  year.  Since then, she has done well, but she is under more stress because her mother is in the hospital now with a GI bleeding issue and her sister has moved up from [place] after having major strokes.  She tells me now they are concerned she has recurrent breast cancer.  She has seen a physician in [place].  [name] herself is doing well.  She wanted to know about blood tests for the cancer markers etc., but we emphasized unless she has something else pointing towards recurrence, we do not usually do routine testing.  She has no obvious lymphadenopathy on physical exam.  Vital signs otherwise stable.  She still remains overweight for her height.  Her weight is 174 pounds which is up 3 pounds.  Blood pressure 111/69, right arm, sitting position, and that is with a large cuff.  Pulse 76 and regular.  Respirations 20 and unlabored.  She is afebrile.  Denies any pain.  She has bilateral arm edema, but no leg edema.  Chest walls: Clear.  She has  again no adenopathy.  Lungs:  Clear to auscultation and percussion.  Heart:  Regular rhythm and rate without murmur, rub or gallop.  Abdomen:  Soft and nontender without organomegaly.  So we will see her back in another year.  Thus far, I think, she remains clinically disease-free.  We will see her sooner if need be.    ______________________________ [name]. Kellie Sleet, MD ESN/MEDQ  D:  04/20/2012  T:  04/20/2012  Job:  [id number]

## 2012-04-20 NOTE — Progress Notes (Signed)
This office note has been dictated.

## 2012-04-24 ENCOUNTER — Other Ambulatory Visit (HOSPITAL_COMMUNITY): Payer: Self-pay

## 2012-04-24 DIAGNOSIS — D649 Anemia, unspecified: Secondary | ICD-10-CM

## 2012-04-30 ENCOUNTER — Other Ambulatory Visit (HOSPITAL_COMMUNITY): Payer: 59

## 2012-10-09 ENCOUNTER — Other Ambulatory Visit (HOSPITAL_COMMUNITY): Payer: 59

## 2012-10-16 ENCOUNTER — Encounter (HOSPITAL_COMMUNITY): Payer: 59 | Attending: Oncology

## 2012-10-16 DIAGNOSIS — C50919 Malignant neoplasm of unspecified site of unspecified female breast: Secondary | ICD-10-CM

## 2012-10-16 DIAGNOSIS — D649 Anemia, unspecified: Secondary | ICD-10-CM | POA: Insufficient documentation

## 2012-10-16 NOTE — Progress Notes (Signed)
Labs drawn today for ferr,Iron &IBC

## 2013-04-19 ENCOUNTER — Encounter (HOSPITAL_COMMUNITY): Payer: Self-pay

## 2013-04-19 ENCOUNTER — Ambulatory Visit (HOSPITAL_COMMUNITY): Payer: 59

## 2013-06-11 ENCOUNTER — Ambulatory Visit (HOSPITAL_COMMUNITY): Payer: 59

## 2013-06-13 ENCOUNTER — Encounter (HOSPITAL_COMMUNITY): Payer: Self-pay

## 2013-06-13 ENCOUNTER — Encounter (HOSPITAL_COMMUNITY): Payer: 59 | Attending: Hematology and Oncology

## 2013-06-13 VITALS — BP 124/80 | HR 69 | Temp 98.1°F | Resp 18 | Wt 182.4 lb

## 2013-06-13 DIAGNOSIS — IMO0002 Reserved for concepts with insufficient information to code with codable children: Secondary | ICD-10-CM

## 2013-06-13 DIAGNOSIS — C50911 Malignant neoplasm of unspecified site of right female breast: Secondary | ICD-10-CM

## 2013-06-13 DIAGNOSIS — C50919 Malignant neoplasm of unspecified site of unspecified female breast: Secondary | ICD-10-CM

## 2013-06-13 DIAGNOSIS — E8989 Other postprocedural endocrine and metabolic complications and disorders: Secondary | ICD-10-CM

## 2013-06-13 DIAGNOSIS — I890 Lymphedema, not elsewhere classified: Secondary | ICD-10-CM

## 2013-06-13 NOTE — Patient Instructions (Addendum)
[facility] Discharge Instructions  RECOMMENDATIONS MADE BY THE CONSULTANT AND ANY TEST RESULTS WILL BE SENT TO YOUR REFERRING PHYSICIAN.  EXAM FINDINGS BY THE PHYSICIAN TODAY AND SIGNS OR SYMPTOMS TO REPORT TO CLINIC OR PRIMARY PHYSICIAN: Exam and discussion by Dr. Sharia Reeve.  No evidence of recurrence by exam.  Let us know if you want to do genetic counseling or be seen in the [facility] here at [facility].  MEDICATIONS PRESCRIBED:  none  INSTRUCTIONS GIVEN AND DISCUSSED: Report any new lumps, bone pain, shortness of breath or other symptoms.  SPECIAL INSTRUCTIONS/FOLLOW-UP: Follow-up in 1 year.  Thank you for choosing [facility] to provide your oncology and hematology care.  To afford each patient quality time with our providers, please arrive at least 15 minutes before your scheduled appointment time.  With your help, our goal is to use those 15 minutes to complete the necessary work-up to ensure our physicians have the information they need to help with your evaluation and healthcare recommendations.    Effective January 1st, 2014, we ask that you re-schedule your appointment with our physicians should you arrive 10 or more minutes late for your appointment.  We strive to give you quality time with our providers, and arriving late affects you and other patients whose appointments are after yours.    Again, thank you for choosing [facility].  Our hope is that these requests will decrease the amount of time that you wait before being seen by our physicians.       _____________________________________________________________  Should you have questions after your visit to [facility], please contact our office at [phone] between the hours of 8:30 a.m. and 5:00 p.m.  Voicemails left after 4:30 p.m. will not be returned until the following business day.  For prescription refill requests, have your pharmacy contact our  office with your prescription refill request.

## 2013-06-13 NOTE — Progress Notes (Addendum)
[facility] [phone]   [phone]  OFFICE PROGRESS NOTE  Kellie Obey, MD [address] [place] [address]  DIAGNOSIS: Bilateral Brest Cancer.   ONCOLOGIC HISTORY: Per Dr [name] previous visit note;  1. Stage III adenocarcinoma of the left breast 9.5 cm in size with  13/24 positive nodes, focal extracapsular extension in several  nodes with LVI. Several nodes were greater than 3 cm. She was  treated with surgery on 12/03/1999 followed by [facility] followed by Taxol  and remains disease free. Her cancer was triple negative.  2. Right-sided breast cancer stage IIA September 1993 with a T2 N0 M0  lesion treated with mastectomy followed by CIF for 6 cycles. ER  receptors were 64fm/mg cytosol protein. PR receptors 23. Cancer was diploid.  S-phase fraction was very low at 0.4%. 12 nodes were negative.  She was treated with tamoxifen after the chemotherapy.  3. BRAC1 positivity in her mother who has had breast cancer. Another  sister died of breast cancer at 51. She has another sister who has  had breast cancer, may have recurrent disease now who is 10.   INTERVAL HISTORY:   [name] 52 y.o. female returns to the clinic today for scheduled yearly followup.  She doesn't that overall she feels well except that she has worsening bilateral upper extremity lymphedema.  She also states that she had seen in lymphedema clinic about 6-7 years ago and had some physiotherapy with improvement but has since stopped doing this.  She denies any new chest lump.  She has bilateral mastectomy scars and has no complaint again in the scars are sites.  She episodic breathing difficulty related to her asthma which she says has been worse in recent hot whether has to use INR 2-3 times a day and at night.  Reportedly she sees Dr. Sudie Estrada for this.  She denies any bone pain.  She has not had BRCA  Tests, despite the mother having positive  results.  She has to go aged  7 and 17 years.  She is unaware of any recent cancer in them.she states that she had bilateral oophorectomy  a few years ago. At this time she tells me she cannot afford to do the testing despite knowing that this could impact their health significantly due to lack of an insurance coverage for this and her unemployed status.    MEDICAL HISTORY: Past Medical History  Diagnosis Date  . Breast cancer     double mastectomy 1990 then 2002  . Asthma   . COPD (chronic obstructive pulmonary disease)   . Arthritis   . Anemia     ALLERGIES:  has no allergies on file.  MEDICATIONS:  Current Outpatient Prescriptions  Medication Sig Dispense Refill  . acetaminophen (TYLENOL) 325 MG tablet Take 650 mg by mouth every 6 (six) hours as needed for pain.      Marland Kitchen albuterol (ACCUNEB) 0.63 MG/3ML nebulizer solution Take 1 ampule by nebulization 2 (two) times daily.      Marland Kitchen albuterol (PROVENTIL HFA;VENTOLIN HFA) 108 (90 BASE) MCG/ACT inhaler Inhale 2 puffs into the lungs every 4 (four) hours as needed.       . Chlorphen-Phenyleph-ASA (ALKA-SELTZER PLUS COLD PO) Take 1 tablet by mouth as needed.      . ferrous sulfate 325 (65 FE) MG EC tablet Take 325 mg by mouth daily.      . folic acid (FOLVITE) 1 MG  tablet Take 1 mg by mouth daily.      . hydrochlorothiazide (HYDRODIURIL) 25 MG tablet Take 25 mg by mouth daily.      . Magnesium Sulfate (EPSOM SALT) POWD Take 10 mLs by mouth as needed. Uses for constipation      . methotrexate (RHEUMATREX) 2.5 MG tablet Take 2.5 mg by mouth once a week. Caution:Chemotherapy. Protect from light. (takes 4 tablets weekly)      . pravastatin (PRAVACHOL) 40 MG tablet Take 40 mg by mouth daily.       No current facility-administered medications for this visit.    SURGICAL HISTORY:  Past Surgical History  Procedure Laterality Date  . Double mastectomy    . Abdominal hysterectomy      complete hysterectomy     REVIEW OF SYSTEMS: 14 point  review of system is as in the history above otherwise negative.    PHYSICAL EXAMINATION:  Blood pressure 124/80, pulse 69, temperature 98.1 F (36.7 C), temperature source Oral, resp. rate 18, weight 182 lb 6.4 oz (82.736 kg). GENERAL: No distress. SKIN:  No rashes or significant lesions  HEAD: Normocephalic, No masses, lesions, tenderness or abnormalities  EYES: Conjunctiva are pink and non-injected  ENT: External ears normal ,lips, buccal mucosa, and tongue normal and mucous membranes are moist  LYMPH: No palpable lymphadenopathy, in the neck supraclavicular areas or axilla. BREAST:bilateral mastectomy scars no evidence of local recurrence. LUNGS: clear to auscultation , no crackles , has occasional wheezes. HEART: regular rate & rhythm, no murmurs, no gallops, S1 normal and S2 normal  ABDOMEN: Abdomen soft, non-tender, normal bowel sounds, no masses or organomegaly and no hepatosplenomegaly palpable MSK: No CVA tenderness and no tenderness on percussion of the back or rib cage. EXTREMITIES:bilateral upper extremity lymphedema week the brawny thickening of the skin NEURO: alert & oriented , no focal motor/sensory deficits.     LABORATORY DATA: Lab Results  Component Value Date   WBC 2.1* 04/20/2012   HGB 12.6 04/20/2012   HCT 39.1 04/20/2012   MCV 78.5 04/20/2012   PLT 197 04/20/2012      Chemistry      Component Value Date/Time   NA 137 04/20/2012 1036   K 3.9 04/20/2012 1036   CL 100 04/20/2012 1036   CO2 28 04/20/2012 1036   BUN 8 04/20/2012 1036   CREATININE 0.63 04/20/2012 1036      Component Value Date/Time   CALCIUM 10.1 04/20/2012 1036   ALKPHOS 58 04/20/2012 1036   AST 31 04/20/2012 1036   ALT 37* 04/20/2012 1036   BILITOT 0.5 04/20/2012 1036       RADIOGRAPHIC STUDIES: No results found.   ASSESSMENT:  [name] has no evidence of disease at this time as regards bilateral breast cancer. She is status post her to  hysterectomy and bilateral salpingo-oophorectomy  in August of 2010.  She's also had bilateral mastectomies.  However I think that in the meantime BRCA1 status will potential of benefit her 2 daughters.  And in her case, given that her mother already has BRCA1 founder  Mutation kown, testing for this patient should be cheaper as this would not involve complete sequencing of BRCA1 and 2 genes.  However her financial and insurance situations at this time is a barrier to testing . She also has chronic bilateral upper extremity lymphedema  With chronic skin thickening changes of lymphedema, declined her referable to lymphedema clinic at this time and financial ground.   PLAN:  1. I suggested  referral to lymphedema clinic but patient declined.  I asked her to inform us if she changes her mind.  She is aware that accepting this recommendation may lead to permanent disfiguring and potentially function impairing  changes in her upper extremities.  2. We also discussed the importance of BRCA testing in her at  The least for the sake of her daughters. She verbalizes understanding of the implication of this.  She was made aware that this could impact her daughters health of significantly.  She is unable to do this at this time due to financial and insurance situation as she tells me.  She is also aware that if she can infrom [place] about the risk of her daughters  having breast and other cancers.  For her, she is status post bilateral mastectomy and oophorectomy which is pretty much all the prophylactic surgery that she would need at this time. I instructed her to inform us when her financial/insurance situation changes so that we can arrange for BRCA1 testing to be done.  3. Return to clinic in one year.  4. I discussed the case with the unit social worker, Karoline Caldwell, she promised to look into available community resources to see if she could secure payment for BRCA 1 testing.     All questions were satisfactorily answered. Patient knows to call if  any concern  arises.  I spent more than 50 % counseling the patient face to face. The total time spent in the appointment was 30 minutes.   Sherral Hammers, MD FACP. Hematology/Oncology.

## 2013-06-14 ENCOUNTER — Telehealth (HOSPITAL_COMMUNITY): Payer: Self-pay | Admitting: Hematology and Oncology

## 2013-06-14 NOTE — Telephone Encounter (Signed)
Called [name] to touch base re Genetic testing. I advised her that  her ins would pay 80/20 and that Maylon Cos was on vacation  and I will contact her next week to get the price of a consult/test. [facility] [phone]

## 2013-06-18 ENCOUNTER — Telehealth (HOSPITAL_COMMUNITY): Payer: Self-pay | Admitting: Hematology and Oncology

## 2013-06-18 NOTE — Telephone Encounter (Signed)
Pc from Maylon Cos re genetic counseling. Per Viviann Spare will pay and from her experience 95%  Of the pts will have less than $100 oop Cost. I shared the info with the pt and she stated she has not made a decision yet but Will continue to consider having the testing done.

## 2013-12-30 ENCOUNTER — Telehealth: Payer: Self-pay | Admitting: *Deleted

## 2014-03-26 ENCOUNTER — Encounter (HOSPITAL_COMMUNITY): Payer: Self-pay | Admitting: Emergency Medicine

## 2014-03-26 ENCOUNTER — Inpatient Hospital Stay (HOSPITAL_COMMUNITY)
Admission: EM | Admit: 2014-03-26 | Discharge: 2014-03-29 | DRG: 603 | Disposition: A | Discharge status: Home / Self Care | Payer: 59 | Attending: Internal Medicine | Admitting: Internal Medicine

## 2014-03-26 ENCOUNTER — Inpatient Hospital Stay (HOSPITAL_COMMUNITY): Payer: 59

## 2014-03-26 DIAGNOSIS — R7402 Elevation of levels of lactic acid dehydrogenase (LDH): Secondary | ICD-10-CM | POA: Diagnosis present

## 2014-03-26 DIAGNOSIS — R740 Nonspecific elevation of levels of transaminase and lactic acid dehydrogenase [LDH]: Secondary | ICD-10-CM

## 2014-03-26 DIAGNOSIS — E669 Obesity, unspecified: Secondary | ICD-10-CM | POA: Diagnosis present

## 2014-03-26 DIAGNOSIS — E876 Hypokalemia: Secondary | ICD-10-CM | POA: Diagnosis present

## 2014-03-26 DIAGNOSIS — R945 Abnormal results of liver function studies: Secondary | ICD-10-CM | POA: Diagnosis present

## 2014-03-26 DIAGNOSIS — I890 Lymphedema, not elsewhere classified: Secondary | ICD-10-CM | POA: Diagnosis present

## 2014-03-26 DIAGNOSIS — C50912 Malignant neoplasm of unspecified site of left female breast: Secondary | ICD-10-CM | POA: Diagnosis present

## 2014-03-26 DIAGNOSIS — C50919 Malignant neoplasm of unspecified site of unspecified female breast: Secondary | ICD-10-CM

## 2014-03-26 DIAGNOSIS — D649 Anemia, unspecified: Secondary | ICD-10-CM | POA: Diagnosis present

## 2014-03-26 DIAGNOSIS — J4489 Other specified chronic obstructive pulmonary disease: Secondary | ICD-10-CM | POA: Diagnosis present

## 2014-03-26 DIAGNOSIS — E8809 Other disorders of plasma-protein metabolism, not elsewhere classified: Secondary | ICD-10-CM | POA: Diagnosis present

## 2014-03-26 DIAGNOSIS — E785 Hyperlipidemia, unspecified: Secondary | ICD-10-CM | POA: Diagnosis present

## 2014-03-26 DIAGNOSIS — M069 Rheumatoid arthritis, unspecified: Secondary | ICD-10-CM | POA: Diagnosis present

## 2014-03-26 DIAGNOSIS — D72819 Decreased white blood cell count, unspecified: Secondary | ICD-10-CM | POA: Diagnosis present

## 2014-03-26 DIAGNOSIS — Z803 Family history of malignant neoplasm of breast: Secondary | ICD-10-CM

## 2014-03-26 DIAGNOSIS — Z9010 Acquired absence of unspecified breast and nipple: Secondary | ICD-10-CM

## 2014-03-26 DIAGNOSIS — IMO0002 Reserved for concepts with insufficient information to code with codable children: Secondary | ICD-10-CM | POA: Diagnosis present

## 2014-03-26 DIAGNOSIS — E7800 Pure hypercholesterolemia, unspecified: Secondary | ICD-10-CM | POA: Diagnosis present

## 2014-03-26 DIAGNOSIS — Z6833 Body mass index (BMI) 33.0-33.9, adult: Secondary | ICD-10-CM | POA: Diagnosis not present

## 2014-03-26 DIAGNOSIS — R112 Nausea with vomiting, unspecified: Secondary | ICD-10-CM | POA: Diagnosis present

## 2014-03-26 DIAGNOSIS — Z853 Personal history of malignant neoplasm of breast: Secondary | ICD-10-CM | POA: Diagnosis not present

## 2014-03-26 DIAGNOSIS — M1990 Unspecified osteoarthritis, unspecified site: Secondary | ICD-10-CM

## 2014-03-26 DIAGNOSIS — Z9221 Personal history of antineoplastic chemotherapy: Secondary | ICD-10-CM | POA: Diagnosis not present

## 2014-03-26 DIAGNOSIS — L0390 Cellulitis, unspecified: Secondary | ICD-10-CM

## 2014-03-26 DIAGNOSIS — R7401 Elevation of levels of liver transaminase levels: Secondary | ICD-10-CM | POA: Diagnosis present

## 2014-03-26 DIAGNOSIS — M7989 Other specified soft tissue disorders: Secondary | ICD-10-CM | POA: Diagnosis present

## 2014-03-26 DIAGNOSIS — L03114 Cellulitis of left upper limb: Secondary | ICD-10-CM | POA: Diagnosis present

## 2014-03-26 DIAGNOSIS — J449 Chronic obstructive pulmonary disease, unspecified: Secondary | ICD-10-CM | POA: Diagnosis present

## 2014-03-26 HISTORY — DX: Lymphedema, not elsewhere classified: I89.0

## 2014-03-26 HISTORY — DX: Pure hypercholesterolemia, unspecified: E78.00

## 2014-03-26 LAB — CBC WITH DIFFERENTIAL/PLATELET
Basophils Absolute: 0 10*3/uL (ref 0.0–0.1)
Basophils Relative: 0 % (ref 0–1)
EOS ABS: 0 10*3/uL (ref 0.0–0.7)
EOS PCT: 0 % (ref 0–5)
HCT: 34.6 % — ABNORMAL LOW (ref 36.0–46.0)
HEMOGLOBIN: 11.3 g/dL — AB (ref 12.0–15.0)
LYMPHS ABS: 1 10*3/uL (ref 0.7–4.0)
Lymphocytes Relative: 15 % (ref 12–46)
MCH: 25.8 pg — AB (ref 26.0–34.0)
MCHC: 32.7 g/dL (ref 30.0–36.0)
MCV: 79 fL (ref 78.0–100.0)
MONO ABS: 0.3 10*3/uL (ref 0.1–1.0)
MONOS PCT: 4 % (ref 3–12)
Neutro Abs: 5.3 10*3/uL (ref 1.7–7.7)
Neutrophils Relative %: 81 % — ABNORMAL HIGH (ref 43–77)
Platelets: 170 10*3/uL (ref 150–400)
RBC: 4.38 MIL/uL (ref 3.87–5.11)
RDW: 16.1 % — ABNORMAL HIGH (ref 11.5–15.5)
WBC: 6.5 10*3/uL (ref 4.0–10.5)

## 2014-03-26 LAB — BASIC METABOLIC PANEL
BUN: 12 mg/dL (ref 6–23)
CHLORIDE: 101 meq/L (ref 96–112)
CO2: 29 mEq/L (ref 19–32)
Calcium: 9.7 mg/dL (ref 8.4–10.5)
Creatinine, Ser: 0.76 mg/dL (ref 0.50–1.10)
GFR calc Af Amer: >90 mL/min (ref >90)
GFR calc non Af Amer: >90 mL/min (ref >90)
Glucose, Bld: 100 mg/dL — ABNORMAL HIGH (ref 70–99)
POTASSIUM: 3.7 meq/L (ref 3.7–5.3)
Sodium: 140 mEq/L (ref 137–147)

## 2014-03-26 LAB — LIPASE, BLOOD: Lipase: 15 U/L (ref 11–59)

## 2014-03-26 MED ORDER — ONDANSETRON HCL 4 MG/2ML IJ SOLN: 4.0000 mg | Freq: Four times a day (QID) | INTRAMUSCULAR | Status: DC | PRN

## 2014-03-26 MED ORDER — ONDANSETRON HCL 4 MG/2ML IJ SOLN
4.0000 mg | Freq: Once | INTRAMUSCULAR | Status: AC
  Administered 2014-03-26: 4 mg via INTRAVENOUS
  Filled 2014-03-26: qty 2

## 2014-03-26 MED ORDER — HYDROMORPHONE HCL PF 1 MG/ML IJ SOLN
1.0000 mg | Freq: Once | INTRAMUSCULAR | Status: AC
  Administered 2014-03-26: 1 mg via INTRAVENOUS
  Filled 2014-03-26: qty 1

## 2014-03-26 MED ORDER — POTASSIUM CHLORIDE CRYS ER 20 MEQ PO TBCR
20.0000 meq | EXTENDED_RELEASE_TABLET | Freq: Every day | ORAL | Status: AC
  Administered 2014-03-27: 20 meq via ORAL
  Filled 2014-03-26: qty 1

## 2014-03-26 MED ORDER — SODIUM CHLORIDE 0.9 % IV BOLUS (SEPSIS)
250.0000 mL | Freq: Once | INTRAVENOUS | Status: AC
  Administered 2014-03-26: 250 mL via INTRAVENOUS

## 2014-03-26 MED ORDER — ACETAMINOPHEN 650 MG RE SUPP: 650.0000 mg | Freq: Four times a day (QID) | RECTAL | Status: DC | PRN

## 2014-03-26 MED ORDER — VANCOMYCIN HCL IN DEXTROSE 1-5 GM/200ML-% IV SOLN
1000.0000 mg | Freq: Once | INTRAVENOUS | Status: AC
  Administered 2014-03-26: 1000 mg via INTRAVENOUS
  Filled 2014-03-26: qty 200

## 2014-03-26 MED ORDER — SENNA 8.6 MG PO TABS
1.0000 | ORAL_TABLET | Freq: Two times a day (BID) | ORAL | Status: DC
  Administered 2014-03-26 – 2014-03-27 (×3): 8.6 mg via ORAL
  Filled 2014-03-26 (×5): qty 1

## 2014-03-26 MED ORDER — HYDROCODONE-ACETAMINOPHEN 5-325 MG PO TABS
1.0000 | ORAL_TABLET | ORAL | Status: DC | PRN
  Administered 2014-03-26 – 2014-03-29 (×5): 1 via ORAL
  Filled 2014-03-26 (×5): qty 1

## 2014-03-26 MED ORDER — PROMETHAZINE HCL 25 MG/ML IJ SOLN
12.5000 mg | Freq: Three times a day (TID) | INTRAMUSCULAR | Status: DC | PRN
  Administered 2014-03-26: 12.5 mg via INTRAVENOUS
  Filled 2014-03-26: qty 1

## 2014-03-26 MED ORDER — ONDANSETRON HCL 4 MG PO TABS: 4.0000 mg | ORAL_TABLET | Freq: Four times a day (QID) | ORAL | Status: DC | PRN

## 2014-03-26 MED ORDER — ALUM & MAG HYDROXIDE-SIMETH 200-200-20 MG/5ML PO SUSP
30.0000 mL | Freq: Four times a day (QID) | ORAL | Status: DC | PRN
Start: 2014-03-26 — End: 2014-03-29

## 2014-03-26 MED ORDER — VANCOMYCIN HCL IN DEXTROSE 1-5 GM/200ML-% IV SOLN
1000.0000 mg | Freq: Two times a day (BID) | INTRAVENOUS | Status: DC
  Administered 2014-03-26 – 2014-03-28 (×5): 1000 mg via INTRAVENOUS
  Filled 2014-03-26 (×10): qty 200

## 2014-03-26 MED ORDER — ENOXAPARIN SODIUM 40 MG/0.4ML ~~LOC~~ SOLN
40.0000 mg | SUBCUTANEOUS | Status: DC
  Administered 2014-03-26 – 2014-03-28 (×3): 40 mg via SUBCUTANEOUS
  Filled 2014-03-26 (×3): qty 0.4

## 2014-03-26 MED ORDER — SODIUM CHLORIDE 0.9 % IV SOLN
INTRAVENOUS | Status: DC
  Administered 2014-03-26 – 2014-03-28 (×3): via INTRAVENOUS

## 2014-03-26 MED ORDER — MORPHINE SULFATE 2 MG/ML IJ SOLN: 1.0000 mg | INTRAMUSCULAR | Status: DC | PRN

## 2014-03-26 MED ORDER — DIPHENHYDRAMINE HCL 25 MG PO CAPS
25.0000 mg | ORAL_CAPSULE | Freq: Every day | ORAL | Status: DC | PRN
  Administered 2014-03-27: 25 mg via ORAL
  Filled 2014-03-26: qty 1

## 2014-03-26 MED ORDER — HYDROCODONE-ACETAMINOPHEN 5-325 MG PO TABS: 1.0000 | ORAL_TABLET | ORAL | Status: DC | PRN

## 2014-03-26 MED ORDER — HYDROCHLOROTHIAZIDE 12.5 MG PO CAPS
12.5000 mg | ORAL_CAPSULE | Freq: Every day | ORAL | Status: DC
  Administered 2014-03-27 – 2014-03-29 (×3): 12.5 mg via ORAL
  Filled 2014-03-26 (×3): qty 1

## 2014-03-26 MED ORDER — ACETAMINOPHEN 325 MG PO TABS: 650.0000 mg | ORAL_TABLET | Freq: Four times a day (QID) | ORAL | Status: DC | PRN

## 2014-03-26 MED ORDER — ALBUTEROL SULFATE (2.5 MG/3ML) 0.083% IN NEBU: 3.0000 mL | INHALATION_SOLUTION | RESPIRATORY_TRACT | Status: DC | PRN

## 2014-03-26 MED ORDER — SODIUM CHLORIDE 0.9 % IV SOLN
INTRAVENOUS | Status: DC
  Administered 2014-03-26: 15:00:00 via INTRAVENOUS

## 2014-03-26 MED ORDER — BISACODYL 10 MG RE SUPP: 10.0000 mg | Freq: Every day | RECTAL | Status: DC | PRN

## 2014-03-26 MED ORDER — SODIUM CHLORIDE 0.9 % IV SOLN
INTRAVENOUS | Status: DC
  Administered 2014-03-26: 12:00:00 via INTRAVENOUS

## 2014-03-26 NOTE — H&P (Addendum)
The patient was seen and examined. She was discussed with nurse practitioner, Ms. Renard Hamper. Agree with her assessment and findings with additions below. This is a 53 year old woman with a history of bilateral mastectomy in 1999 and 2001 for treatment of breast cancer. As a result, she has had chronic lymphedema in both arms. She also had cellulitis in 2009 which was associated with a bug bite. She presents today with worsening swelling in her left arm with associated erythema. She also has had subjective fever and chills as well as an objective fever of 101 at home. Prior to receiving IV Dilaudid in the emergency department, she had no prior nausea or vomiting, but she did have constitutional symptoms including myalgias and a headache.  On exam, her lungs are clear and her heart reveals S1-S2 with a soft systolic murmur. Her abdomen is obese with positive bowel sounds without tenderness or distention. Her upper extremities reveal global edema, approximately 2+ nonpitting with slightly more edema of the left upper extremity. On the left upper extremity there is mild erythema starting from the deltoid to the forearm to the wrist. There is associated warmth and mild global tenderness.  We'll continue vancomycin started in the ED, supportive treatment, and gentle IV fluids. We'll order a left upper extremity venous ultrasound to rule out DVT. We'll discontinue Dilaudid in favor of as needed hydrocodone or IV morphine for discomfort. If her nausea and vomiting resolve in the morning, her diet could be advanced as tolerated.

## 2014-03-26 NOTE — ED Notes (Signed)
Fever, chills, swelling and pain to left arm with hx of lymphedema.

## 2014-03-26 NOTE — ED Provider Notes (Signed)
CSN: [id number]     Arrival date & time 03/26/14  5366 History  This chart was scribed for Mervin Kung, MD by Ludger Nutting, ED Scribe. This patient was seen in room APA12/APA12 and the patient's care was started 11:23 AM.    Chief Complaint  Patient presents with  . Arm Swelling      The history is provided by the patient. No language interpreter was used.    HPI Comments: [name] is a 53 y.o. female with past medical history of breast cancer, lymphedema, double mastectomy who presents to the Emergency Department complaining of constant swelling, redness, warmth, and pain to the left upper extremity that began 2 days ago. She reports associated chills and fever of 101 F at home. She also has associated, 10/10 headache, low back pain, and neck pain that also began 2 days ago. She reports similar symptoms in the past and was diagnosed with cellulitis. She denies recent antibiotic use. She denies nausea, vomiting, lightheadedness.   Past Medical History  Diagnosis Date  . Breast cancer     double mastectomy 1990 then 2002  . Asthma   . COPD (chronic obstructive pulmonary disease)   . Arthritis   . Anemia   . High cholesterol    Past Surgical History  Procedure Laterality Date  . Double mastectomy    . Abdominal hysterectomy      complete hysterectomy   Family History  Problem Relation Age of Onset  . Breast cancer Mother   . Breast cancer Sister   . Breast cancer Sister    History  Substance Use Topics  . Smoking status: Never Smoker   . Smokeless tobacco: Never Used  . Alcohol Use: No   OB History   Grav Para Term Preterm Abortions TAB SAB Ect Mult Living                 Review of Systems  Constitutional: Positive for fever and chills.  HENT: Negative for rhinorrhea and sore throat.   Eyes: Negative for visual disturbance.  Respiratory: Negative for cough and shortness of breath.   Cardiovascular: Negative for chest pain and leg swelling.   Gastrointestinal: Negative for nausea, vomiting and abdominal pain.  Genitourinary: Negative for dysuria.  Musculoskeletal: Positive for neck pain. Negative for back pain.  Skin: Positive for rash (LUE).  Neurological: Positive for headaches.  Hematological: Does not bruise/bleed easily.  Psychiatric/Behavioral: Negative for confusion.      Allergies  Review of patient's allergies indicates no known allergies.  Home Medications   Prior to Admission medications   Medication Sig Start Date End Date Taking? Authorizing Provider  acetaminophen (TYLENOL) 325 MG tablet Take 650 mg by mouth every 6 (six) hours as needed for pain.   Yes Historical Provider, MD  albuterol (ACCUNEB) 0.63 MG/3ML nebulizer solution Take 1 ampule by nebulization 2 (two) times daily.   Yes Historical Provider, MD  albuterol (PROVENTIL HFA;VENTOLIN HFA) 108 (90 BASE) MCG/ACT inhaler Inhale 2 puffs into the lungs every 4 (four) hours as needed.    Yes Historical Provider, MD  Chlorphen-Phenyleph-ASA (ALKA-SELTZER PLUS COLD PO) Take 1 tablet by mouth as needed (pain).    Yes Historical Provider, MD  diphenhydrAMINE (BENADRYL) 25 MG tablet Take 25 mg by mouth daily as needed (pain).   Yes Historical Provider, MD  ferrous sulfate 325 (65 FE) MG EC tablet Take 325 mg by mouth daily.   Yes Historical Provider, MD  folic acid (FOLVITE) 1 MG  tablet Take 1 mg by mouth daily.   Yes Historical Provider, MD  hydrochlorothiazide (HYDRODIURIL) 25 MG tablet Take 25 mg by mouth daily.   Yes Historical Provider, MD  methotrexate (RHEUMATREX) 2.5 MG tablet Take 2.5 mg by mouth once a week. Caution:Chemotherapy. Protect from light. (takes 4 tablets weekly)   Yes Historical Provider, MD  naproxen sodium (ANAPROX) 220 MG tablet Take 440 mg by mouth daily as needed (pain).   Yes Historical Provider, MD  pravastatin (PRAVACHOL) 40 MG tablet Take 40 mg by mouth daily.   Yes Historical Provider, MD   BP 112/61  Pulse 91  Temp(Src) 98.9 F  (37.2 C) (Oral)  Resp 20  Ht 5\' 1"  (1.549 m)  Wt 175 lb (79.379 kg)  BMI 33.08 kg/m2  SpO2 98% Physical Exam  Nursing note and vitals reviewed. Constitutional: She is oriented to person, place, and time. She appears well-developed and well-nourished.  HENT:  Head: Normocephalic and atraumatic.  Mouth/Throat: Oropharynx is clear and moist and mucous membranes are normal.  Cardiovascular: Normal rate, regular rhythm and normal heart sounds.   No murmur heard. Pulmonary/Chest: Effort normal and breath sounds normal. No respiratory distress.  Abdominal: Soft. Bowel sounds are normal. She exhibits no distension. There is no tenderness.  Neurological: She is alert and oriented to person, place, and time.  Skin: Skin is warm and dry.  Extensive redness and increased warmth from left hand up to left shoulder. Left radial pulse not palpable. Cap refill is 1 sec bilaterally.   Right radial pulse is 1+. RUE has swelling but no redness.   Psychiatric: She has a normal mood and affect.    ED Course  Procedures (including critical care time)  DIAGNOSTIC STUDIES: Oxygen Saturation is 99% on RA, normal by my interpretation.    COORDINATION OF CARE: 11:27 AM Discussed treatment plan with pt at bedside and pt agreed to plan.   Labs Review Labs Reviewed  CBC WITH DIFFERENTIAL - Abnormal; Notable for the following:    Hemoglobin 11.3 (*)    HCT 34.6 (*)    MCH 25.8 (*)    RDW 16.1 (*)    Neutrophils Relative % 81 (*)    All other components within normal limits  BASIC METABOLIC PANEL - Abnormal; Notable for the following:    Glucose, Bld 100 (*)    All other components within normal limits   Results for orders placed during the hospital encounter of 03/26/14  CBC WITH DIFFERENTIAL      Result Value Ref Range   WBC 6.5  4.0 - 10.5 K/uL   RBC 4.38  3.87 - 5.11 MIL/uL   Hemoglobin 11.3 (*) 12.0 - 15.0 g/dL   HCT 34.6 (*) 36.0 - 46.0 %   MCV 79.0  78.0 - 100.0 fL   MCH 25.8 (*) 26.0 -  34.0 pg   MCHC 32.7  30.0 - 36.0 g/dL   RDW 16.1 (*) 11.5 - 15.5 %   Platelets 170  150 - 400 K/uL   Neutrophils Relative % 81 (*) 43 - 77 %   Neutro Abs 5.3  1.7 - 7.7 K/uL   Lymphocytes Relative 15  12 - 46 %   Lymphs Abs 1.0  0.7 - 4.0 K/uL   Monocytes Relative 4  3 - 12 %   Monocytes Absolute 0.3  0.1 - 1.0 K/uL   Eosinophils Relative 0  0 - 5 %   Eosinophils Absolute 0.0  0.0 - 0.7 K/uL  Basophils Relative 0  0 - 1 %   Basophils Absolute 0.0  0.0 - 0.1 K/uL  BASIC METABOLIC PANEL      Result Value Ref Range   Sodium 140  137 - 147 mEq/L   Potassium 3.7  3.7 - 5.3 mEq/L   Chloride 101  96 - 112 mEq/L   CO2 29  19 - 32 mEq/L   Glucose, Bld 100 (*) 70 - 99 mg/dL   BUN 12  6 - 23 mg/dL   Creatinine, Ser 0.76  0.50 - 1.10 mg/dL   Calcium 9.7  8.4 - 10.5 mg/dL   GFR calc non Af Amer >90  >90 mL/min   GFR calc Af Amer >90  >90 mL/min     Imaging Review No results found.   EKG Interpretation None      MDM   Final diagnoses:  Cellulitis    Patient with long-standing history of bilateral arm lymphedema following bilateral mastectomies. Patient said trouble with occasional development of cellulitis. Patient started with cellulitis in her left arm on Monday. Now has redness from the hand to the shoulder and increased warmth. Swelling is not particularly worse than usual but slightly worse. Patient without fever. Consistent with cellulitis. Patient treated with vancomycin. Patient will require admission. Patient is followed by Dr. Karie Kirks. Admission arranged to the hospitalist.  I personally performed the services described in this documentation, which was scribed in my presence. The recorded information has been reviewed and is accurate.    Mervin Kung, MD 03/26/14 1409

## 2014-03-26 NOTE — H&P (Signed)
[facility] History and Physical  [name] HUT:[id number] DOB: 05-02-1961 DOA: 03/26/2014  Referring physician:  PCP: Robert Bellow, MD   Chief Complaint: Left arm swelling  HPI: [name] is a very pleasant 53 y.o. female with a past medical history of breast cancer status post  mastectomy in 1999 2001, chronic lymphedema, COPD, arthritis presents to the emergency department with the chief complaint of left arm swelling. Patient reports that 2 days ago she developed gradual worsening swelling of her left arm. In addition the arm began to feel "tight and achy all over". By the end of the day erythema and heat had developed from shoulder to wrist. Associated symptoms include chills, fever of 101 orally, nausea without vomiting and headache. She denies any chest pain palpitations, abdominal pain diarrhea dysuria hematuria frequency or urgency. She states that she took Advil and Alka-Seltzer cold remedy with little relief. She reports that she believes she was hospitalized for cellulitis in 2009 that was related to a bug bite. She also reports that her mother is currently in the hospital at [facility] on contact isolation and she has been visiting in the has been exposed. She denies any recent bug bite or trauma to the arm.  Initial evaluation in the emergency department includes a complete blood count with a hemoglobin of 11.3. Recent metabolic panel with serum glucose of 100 otherwise unremarkable. She is afebrile and hemodynamically stable. She received 250 cc of normal saline intravenously and one dose of vancomycin. In addition she experienced nausea with 2 episodes of emesis and received Zofran for that. She also got 1 mg of Dilaudid intravenously for pain. He are asked to admit  Review of Systems:  10 point review of systems completed and all systems are negative except as indicated in the history of present illness   Past Medical History  Diagnosis Date  . Breast cancer      double mastectomy 1990 then 2002  . Asthma   . COPD (chronic obstructive pulmonary disease)   . Arthritis   . Anemia   . High cholesterol    Past Surgical History  Procedure Laterality Date  . Double mastectomy    . Abdominal hysterectomy      complete hysterectomy   Social History:  reports that she has never smoked. She has never used smokeless tobacco. She reports that she does not drink alcohol or use illicit drugs.  No Known Allergies  Family History  Problem Relation Age of Onset  . Breast cancer Mother   . Breast cancer Sister   . Breast cancer Sister      Prior to Admission medications   Medication Sig Start Date End Date Taking? Authorizing Provider  acetaminophen (TYLENOL) 325 MG tablet Take 650 mg by mouth every 6 (six) hours as needed for pain.   Yes Historical Provider, MD  albuterol (ACCUNEB) 0.63 MG/3ML nebulizer solution Take 1 ampule by nebulization 2 (two) times daily.   Yes Historical Provider, MD  albuterol (PROVENTIL HFA;VENTOLIN HFA) 108 (90 BASE) MCG/ACT inhaler Inhale 2 puffs into the lungs every 4 (four) hours as needed.    Yes Historical Provider, MD  Chlorphen-Phenyleph-ASA (ALKA-SELTZER PLUS COLD PO) Take 1 tablet by mouth as needed (pain).    Yes Historical Provider, MD  diphenhydrAMINE (BENADRYL) 25 MG tablet Take 25 mg by mouth daily as needed (pain).   Yes Historical Provider, MD  ferrous sulfate 325 (65 FE) MG EC tablet Take 325 mg by mouth daily.  Yes Historical Provider, MD  folic acid (FOLVITE) 1 MG tablet Take 1 mg by mouth daily.   Yes Historical Provider, MD  hydrochlorothiazide (HYDRODIURIL) 25 MG tablet Take 25 mg by mouth daily.   Yes Historical Provider, MD  methotrexate (RHEUMATREX) 2.5 MG tablet Take 2.5 mg by mouth once a week. Caution:Chemotherapy. Protect from light. (takes 4 tablets weekly)   Yes Historical Provider, MD  naproxen sodium (ANAPROX) 220 MG tablet Take 440 mg by mouth daily as needed (pain).   Yes Historical  Provider, MD  pravastatin (PRAVACHOL) 40 MG tablet Take 40 mg by mouth daily.   Yes Historical Provider, MD   Physical Exam: Filed Vitals:   03/26/14 1335  BP: 112/61  Pulse:   Temp:   Resp:     BP 112/61  Pulse 91  Temp(Src) 98.9 F (37.2 C) (Oral)  Resp 20  Ht 5\' 1"  (1.549 m)  Wt 79.379 kg (175 lb)  BMI 33.08 kg/m2  SpO2 98%  General:  Appears calm and somewhat ill appearing Eyes: PERRL, normal lids, irises & conjunctiva ENT: grossly normal hearing, lips & tongue he does membranes of her mouth are slightly dry but pink Neck: no LAD, masses or thyromegaly Cardiovascular: RRR, no m/r/g. No LE edema. Respiratory: CTA bilaterally, no w/r/r. Normal respiratory effort. Abdomen: soft, ntnd, obese positive bowel sounds throughout no rebounding no guarding Skin: Erythema to left upper extremity from shoulder to wrist and hand. No other rash noted Musculoskeletal: Bilateral upper extremity edema with left greater than right. Right arm with erythema, tenderness to touch, and warmth.  Psychiatric: grossly normal mood and affect, speech fluent and appropriate Neurologic: grossly non-focal. Cranial nerves II through XII grossly intact speech clear facial symmetry           Labs on Admission:  Basic Metabolic Panel:  Recent Labs Lab 03/26/14 1207  NA 140  K 3.7  CL 101  CO2 29  GLUCOSE 100*  BUN 12  CREATININE 0.76  CALCIUM 9.7   Liver Function Tests: No results found for this basename: AST, ALT, ALKPHOS, BILITOT, PROT, ALBUMIN,  in the last 168 hours No results found for this basename: LIPASE, AMYLASE,  in the last 168 hours No results found for this basename: AMMONIA,  in the last 168 hours CBC:  Recent Labs Lab 03/26/14 1118  WBC 6.5  NEUTROABS 5.3  HGB 11.3*  HCT 34.6*  MCV 79.0  PLT 170   Cardiac Enzymes: No results found for this basename: CKTOTAL, CKMB, CKMBINDEX, TROPONINI,  in the last 168 hours  BNP (last 3 results) No results found for this  basename: PROBNP,  in the last 8760 hours CBG: No results found for this basename: GLUCAP,  in the last 168 hours  Radiological Exams on Admission: No results found.  EKG:   Assessment/Plan Principal Problem:   Cellulitis: Of left upper extremity in the setting of chronic lymphedema related to mastectomy. Will admit to medical floor. Will continue vancomycin to be dosed by pharmacy. Will elevate. Will provide pain medicine as indicated. Currently left radial pulse nonpalpable. Will monitor C&S of left hand and arm. Will obtain doppler to rule out DVT.  She is currently afebrile and nontoxic appearing Active Problems:    Nausea and vomiting: 2 episodes of emesis while in the emergency department. Likely related to #1. Clear bile substance. No coffee ground emesis. Will provide Zofran every 4 hours as needed and Phenergan low dose every 8 hours for any breakthrough. Will keep her  on a clear liquid diet for now to advance as indicated.    COPD (chronic obstructive pulmonary disease): Appears stable at baseline. Continue her home inhaler    Arthritis: Patient on methotrexate weekly. Reports that her dose is due on Wednesdays which is today. Will hold for now    Anemia: Appears to be stable at baseline. No signs and symptoms of bleeding. I'm going to hold her time iron and folate due to #2 for now. Will resume when indicated    High cholesterol: Will obtain a lipid panel. Will hold her home statin due to #2    Breast cancer: Chart review indicates last visit July 2014 for her yearly follow up. Note indicates she had stage III adenocarcinoma of the left breast treated with surgery followed by Taxol. Right-sided breast cancer stage II a in 1993 status post mastectomy and chemotherapy no. Treated with Tamofoxifen and after the chemotherapy  Lymphedema: chronic. Chart review indicates referred to lymphedema clinic but she declined. She reports having compression sleeve but does no wear as it "does  not work and just pushes it all to my hand".     Code Status: full  Family Communication: none present Disposition Plan: home when ready  Time spent: 60 minutes  [facility] Pager [phone]

## 2014-03-26 NOTE — Progress Notes (Signed)
ANTIBIOTIC CONSULT NOTE - INITIAL  Pharmacy Consult for Vancomycin Indication: cellulitis  No Known Allergies  Patient Measurements: Height: 5\' 1"  (154.9 cm) Weight: 175 lb (79.379 kg) IBW/kg (Calculated) : 47.8  Vital Signs: Temp: 98.9 F (37.2 C) (04/29 1328) Temp src: Oral (04/29 1328) BP: 107/66 mmHg (04/29 1519) Pulse Rate: 87 (04/29 1519) Intake/Output from previous day:   Intake/Output from this shift:    Labs:  Recent Labs  03/26/14 1118 03/26/14 1207  WBC 6.5  --   HGB 11.3*  --   PLT 170  --   CREATININE  --  0.76   Estimated Creatinine Clearance: 75.8 ml/min (by C-G formula based on Cr of 0.76). No results found for this basename: VANCOTROUGH, VANCOPEAK, VANCORANDOM, GENTTROUGH, GENTPEAK, GENTRANDOM, TOBRATROUGH, TOBRAPEAK, TOBRARND, AMIKACINPEAK, AMIKACINTROU, AMIKACIN,  in the last 72 hours   Microbiology: No results found for this or any previous visit (from the past 720 hour(s)).  Medical History: Past Medical History  Diagnosis Date  . Breast cancer     double mastectomy 1990 then 2002  . Asthma   . COPD (chronic obstructive pulmonary disease)   . Arthritis   . Anemia   . High cholesterol   . Lymphedema    Medications:  Scheduled:  . enoxaparin (LOVENOX) injection  40 mg Subcutaneous Q24H  . senna  1 tablet Oral BID  . vancomycin  1,000 mg Intravenous Q12H   Assessment: 53yo female admitted for cellulitis.  Asked to initiate Vancomycin per protocol.  Pt received initial dose earlier today. Pt has good renal fxn.  Estimated Creatinine Clearance: 75.8 ml/min (by C-G formula based on Cr of 0.76).  Goal of Therapy:  Vancomycin trough level 10-15 mcg/ml  Plan:  Vancomycin 1gm IV q12hrs Check trough at steady state Monitor labs, renal fxn, and cultures  Makana Feigel A Hykeem Ojeda 03/26/2014,4:58 PM

## 2014-03-27 LAB — CBC
HCT: 32.3 % — ABNORMAL LOW (ref 36.0–46.0)
Hemoglobin: 10.4 g/dL — ABNORMAL LOW (ref 12.0–15.0)
MCH: 25.7 pg — AB (ref 26.0–34.0)
MCHC: 32.2 g/dL (ref 30.0–36.0)
MCV: 80 fL (ref 78.0–100.0)
PLATELETS: 176 10*3/uL (ref 150–400)
RBC: 4.04 MIL/uL (ref 3.87–5.11)
RDW: 16.3 % — ABNORMAL HIGH (ref 11.5–15.5)
WBC: 5.7 10*3/uL (ref 4.0–10.5)

## 2014-03-27 LAB — URINALYSIS, ROUTINE W REFLEX MICROSCOPIC
BILIRUBIN URINE: NEGATIVE
Glucose, UA: NEGATIVE mg/dL
Hgb urine dipstick: NEGATIVE
KETONES UR: NEGATIVE mg/dL
Leukocytes, UA: NEGATIVE
Nitrite: NEGATIVE
PH: 6 (ref 5.0–8.0)
Protein, ur: NEGATIVE mg/dL
UROBILINOGEN UA: 1 mg/dL (ref 0.0–1.0)

## 2014-03-27 LAB — BASIC METABOLIC PANEL
BUN: 12 mg/dL (ref 6–23)
CO2: 26 mEq/L (ref 19–32)
CREATININE: 0.73 mg/dL (ref 0.50–1.10)
Calcium: 9.1 mg/dL (ref 8.4–10.5)
Chloride: 102 mEq/L (ref 96–112)
GFR calc Af Amer: >90 mL/min (ref >90)
Glucose, Bld: 92 mg/dL (ref 70–99)
Potassium: 3.8 mEq/L (ref 3.7–5.3)
SODIUM: 138 meq/L (ref 137–147)

## 2014-03-27 LAB — IRON AND TIBC
Iron: 34 ug/dL — ABNORMAL LOW (ref 42–135)
SATURATION RATIOS: 15 % — AB (ref 20–55)
TIBC: 228 ug/dL — ABNORMAL LOW (ref 250–470)
UIBC: 194 ug/dL (ref 125–400)

## 2014-03-27 LAB — RETICULOCYTES
RBC.: 4.1 MIL/uL (ref 3.87–5.11)
RETIC COUNT ABSOLUTE: 73.8 10*3/uL (ref 19.0–186.0)
RETIC CT PCT: 1.8 % (ref 0.4–3.1)

## 2014-03-27 LAB — TSH: TSH: 3.22 u[IU]/mL (ref 0.350–4.500)

## 2014-03-27 LAB — VITAMIN B12: Vitamin B-12: 288 pg/mL (ref 211–911)

## 2014-03-27 LAB — FERRITIN: Ferritin: 560 ng/mL — ABNORMAL HIGH (ref 10–291)

## 2014-03-27 LAB — FOLATE: FOLATE: 14.3 ng/mL

## 2014-03-27 MED ORDER — PIPERACILLIN-TAZOBACTAM 3.375 G IVPB
3.3750 g | Freq: Three times a day (TID) | INTRAVENOUS | Status: DC
  Administered 2014-03-27 – 2014-03-29 (×6): 3.375 g via INTRAVENOUS
  Filled 2014-03-27 (×13): qty 50

## 2014-03-27 MED ORDER — METHOTREXATE 2.5 MG PO TABS
10.0000 mg | ORAL_TABLET | ORAL | Status: DC
  Administered 2014-03-27: 10 mg via ORAL
  Filled 2014-03-27: qty 4

## 2014-03-27 NOTE — Progress Notes (Signed)
ANTIBIOTIC CONSULT NOTE - follow up  Pharmacy Consult for Vancomycin Indication: cellulitis  No Known Allergies  Patient Measurements: Height: 5\' 1"  (154.9 cm) Weight: 175 lb (79.379 kg) IBW/kg (Calculated) : 47.8  Vital Signs: Temp: 97.9 F (36.6 C) (04/30 0548) Temp src: Oral (04/30 0548) BP: 102/61 mmHg (04/30 0548) Pulse Rate: 81 (04/30 0548) Intake/Output from previous day: 04/29 0701 - 04/30 0700 In: 774.2 [I.V.:574.2; IV Piggyback:200] Out: -  Intake/Output from this shift:    Labs:  Recent Labs  03/26/14 1118 03/26/14 1207 03/27/14 0455  WBC 6.5  --  5.7  HGB 11.3*  --  10.4*  PLT 170  --  176  CREATININE  --  0.76 0.73   Estimated Creatinine Clearance: 75.8 ml/min (by C-G formula based on Cr of 0.73). No results found for this basename: VANCOTROUGH, VANCOPEAK, VANCORANDOM, GENTTROUGH, GENTPEAK, GENTRANDOM, TOBRATROUGH, TOBRAPEAK, TOBRARND, AMIKACINPEAK, AMIKACINTROU, AMIKACIN,  in the last 72 hours   Microbiology: No results found for this or any previous visit (from the past 720 hour(s)).  Medical History: Past Medical History  Diagnosis Date  . Breast cancer     double mastectomy 1990 then 2002  . Asthma   . COPD (chronic obstructive pulmonary disease)   . Arthritis   . Anemia   . High cholesterol   . Chronic acquired lymphedema    Medications:  Scheduled:  . enoxaparin (LOVENOX) injection  40 mg Subcutaneous Q24H  . hydrochlorothiazide  12.5 mg Oral Daily  . piperacillin-tazobactam (ZOSYN)  IV  3.375 g Intravenous Q8H  . potassium chloride  20 mEq Oral Daily  . senna  1 tablet Oral BID  . vancomycin  1,000 mg Intravenous Q12H   Assessment: 53yo female admitted for cellulitis.  Asked to initiate Vancomycin per protocol on 4/29.  Zosyn added on 4/30.   Pt has good renal fxn and is currently afebrile.   Estimated Creatinine Clearance: 75.8 ml/min (by C-G formula based on Cr of 0.73).  Vancomycin 4/29 >> Zosyn 4/30 >>  Goal of Therapy:   Vancomycin trough level 10-15 mcg/ml  Plan:   Vancomycin 1gm IV q12hrs  Check trough at steady state  Zosyn 3.375gm IV q8h, each dose over 4 hrs  de-escalate abx when improved / appropriate  Monitor labs, renal fxn, and cultures  Dalaina Tates A Braxtyn Bojarski 03/27/2014,8:56 AM

## 2014-03-27 NOTE — Progress Notes (Signed)
[name] YWV:[id number] DOB: January 02, 1961 DOA: 03/26/2014 PCP: Robert Bellow, MD   Subjective: This lady , who has a history of bilateral breast cancer and mastectomies following these diagnoses, presents with left arm cellulitis. She does not have a DVT on venous Doppler. She has been started appropriately on intravenous vancomycin. She says that the redness has improved from yesterday. She said that her symptoms started 3 days ago when she started having chills and swelling and tightness of the left arm. 2 days prior to that she had been working out in the yard.           Physical Exam: Blood pressure 102/61, pulse 81, temperature 97.9 F (36.6 C), temperature source Oral, resp. rate 20, height 5\' 1"  (1.549 m), weight 79.379 kg (175 lb), SpO2 93.00%. She looks systemically well. She is nontoxic or septic. The left arm clearly is swollen and has cellulitis throughout. Lung fields are clear. Heart sounds are present without murmurs. She is alert and oriented. The right arm appears to be without any cellulitis.   Investigations:  No results found for this or any previous visit (from the past 240 hour(s)).   Basic Metabolic Panel:  Recent Labs  03/26/14 1207 03/27/14 0455  NA 140 138  K 3.7 3.8  CL 101 102  CO2 29 26  GLUCOSE 100* 92  BUN 12 12  CREATININE 0.76 0.73  CALCIUM 9.7 9.1      CBC:  Recent Labs  03/26/14 1118 03/27/14 0455  WBC 6.5 5.7  NEUTROABS 5.3  --   HGB 11.3* 10.4*  HCT 34.6* 32.3*  MCV 79.0 80.0  PLT 170 176    Dg Abd 1 View  03/26/2014   CLINICAL DATA:  Abdominal pain, nausea, vomiting, past history breast cancer, asthma, COPD  EXAM: ABDOMEN - 1 VIEW  COMPARISON:  None  FINDINGS: Scattered gas and stool within the colon predominately at the transverse colon and proximal descending colon.  Paucity of small bowel gas.  Small amount of rectosigmoid gas noted.  No evidence of bowel obstruction or bowel wall thickening.  Bones  unremarkable.  Numerous pelvic phleboliths.  No definite renal calcifications.  IMPRESSION: Nonobstructive bowel gas pattern.   Electronically Signed   By: Lavonia Dana M.D.   On: 03/26/2014 19:35   US Venous Img Upper Uni Left  03/26/2014   CLINICAL DATA:  LEFT arm pain and swelling for 3 days question DVT, cellulitis, history LEFT  EXAM: LEFT UPPER EXTREMITY VENOUS DOPPLER ULTRASOUND  TECHNIQUE: Gray-scale sonography with graded compression, as well as color Doppler and duplex ultrasound were performed to evaluate the upper extremity deep venous system from the level of the subclavian vein and including the jugular, axillary, basilic, radial, ulnar and upper cephalic vein. Spectral Doppler was utilized to evaluate flow at rest and with distal augmentation maneuvers.  COMPARISON:  None  FINDINGS: Internal Jugular Vein: No evidence of thrombus. Normal compressibility, respiratory phasicity and response to augmentation.  Subclavian Vein: No evidence of thrombus. Normal compressibility, respiratory phasicity and response to augmentation.  Axillary Vein: No evidence of thrombus. Normal compressibility, respiratory phasicity and response to augmentation.  Cephalic Vein: No evidence of thrombus. Normal compressibility, respiratory phasicity and response to augmentation.  Basilic Vein: No evidence of thrombus. Normal compressibility, respiratory phasicity and response to augmentation.  Brachial Veins: No evidence of thrombus. Normal compressibility, respiratory phasicity and response to augmentation.  Radial Veins: No evidence of thrombus. Normal compressibility, respiratory phasicity and response to augmentation.  Ulnar  Veins: No evidence of thrombus. Normal compressibility, respiratory phasicity and response to augmentation.  Venous Reflux:  None visualized.  Other Findings:  None visualized.  IMPRESSION: No evidence of deep venous thrombosis in the LEFT upper extremity.   Electronically Signed   By: Lavonia Dana M.D.    On: 03/26/2014 18:33      Medications: I have reviewed the patient's current medications.  Impression: 1. Acute left arm cellulitis. 2. Previous history of breast cancer with bilateral mastectomies. 3. Chronic acquired lymphedema secondary to #2. 4. COPD/asthma, stable. 5. Obesity.     Plan: 1. Add intravenous Zosyn to the vancomycin. 2. Continue with all other medications. 3. Mobilize.  Consultants:  None.   Procedures:  None.   Antibiotics:  Intravenous vancomycin and intravenous Zosyn started today.                   Code Status: Full code.  Family Communication: I discussed the plan with patient at the bedside.   Disposition Plan: Home when medically stable.  Time spent: 15 minutes.   LOS: 1 day   [name]   03/27/2014, 8:47 AM

## 2014-03-28 MED ORDER — SODIUM CHLORIDE 0.9 % IV SOLN: 250.0000 mL | INTRAVENOUS | Status: DC | PRN

## 2014-03-28 MED ORDER — SODIUM CHLORIDE 0.9 % IJ SOLN: 3.0000 mL | INTRAMUSCULAR | Status: DC | PRN

## 2014-03-28 MED ORDER — SODIUM CHLORIDE 0.9 % IJ SOLN
3.0000 mL | Freq: Two times a day (BID) | INTRAMUSCULAR | Status: DC
  Administered 2014-03-28: 3 mL via INTRAVENOUS

## 2014-03-28 NOTE — Progress Notes (Signed)
[name] NLZ:[id number] DOB: [birth year] DOA: 03/26/2014 PCP: Robert Bellow, MD   Subjective: This lady , who has a history of bilateral breast cancer and mastectomies following these diagnoses, presents with left arm cellulitis. She does not have a DVT on venous Doppler. She has been started appropriately on intravenous vancomycin and yesterday I added Zosyn. Her arm is improving and she is able to flex it more. The redness appears to be improving.           Physical Exam: Blood pressure 104/68, pulse 76, temperature 98.2 F (36.8 C), temperature source Oral, resp. rate 20, height 5\' 1"  (1.549 m), weight 79.379 kg (175 lb), SpO2 95.00%. She looks systemically well. She is nontoxic or septic. The left arm clearly is swollen and has cellulitis throughout, improving.. Lung fields are clear. Heart sounds are present without murmurs. She is alert and oriented. The right arm appears to be without any cellulitis.   Investigations:  No results found for this or any previous visit (from the past 240 hour(s)).   Basic Metabolic Panel:  Recent Labs  03/26/14 1207 03/27/14 0455  NA 140 138  K 3.7 3.8  CL 101 102  CO2 29 26  GLUCOSE 100* 92  BUN 12 12  CREATININE 0.76 0.73  CALCIUM 9.7 9.1      CBC:  Recent Labs  03/26/14 1118 03/27/14 0455  WBC 6.5 5.7  NEUTROABS 5.3  --   HGB 11.3* 10.4*  HCT 34.6* 32.3*  MCV 79.0 80.0  PLT 170 176    Dg Abd 1 View  03/26/2014   CLINICAL DATA:  Abdominal pain, nausea, vomiting, past history breast cancer, asthma, COPD  EXAM: ABDOMEN - 1 VIEW  COMPARISON:  None  FINDINGS: Scattered gas and stool within the colon predominately at the transverse colon and proximal descending colon.  Paucity of small bowel gas.  Small amount of rectosigmoid gas noted.  No evidence of bowel obstruction or bowel wall thickening.  Bones unremarkable.  Numerous pelvic phleboliths.  No definite renal calcifications.  IMPRESSION: Nonobstructive bowel  gas pattern.   Electronically Signed   By: [name] M.D.   On: 03/26/2014 19:35   US Venous Img Upper Uni Left  03/26/2014   CLINICAL DATA:  LEFT arm pain and swelling for 3 days question DVT, cellulitis, history LEFT  EXAM: LEFT UPPER EXTREMITY VENOUS DOPPLER ULTRASOUND  TECHNIQUE: Gray-scale sonography with graded compression, as well as color Doppler and duplex ultrasound were performed to evaluate the upper extremity deep venous system from the level of the subclavian vein and including the jugular, axillary, basilic, radial, ulnar and upper cephalic vein. Spectral Doppler was utilized to evaluate flow at rest and with distal augmentation maneuvers.  COMPARISON:  None  FINDINGS: Internal Jugular Vein: No evidence of thrombus. Normal compressibility, respiratory phasicity and response to augmentation.  Subclavian Vein: No evidence of thrombus. Normal compressibility, respiratory phasicity and response to augmentation.  Axillary Vein: No evidence of thrombus. Normal compressibility, respiratory phasicity and response to augmentation.  Cephalic Vein: No evidence of thrombus. Normal compressibility, respiratory phasicity and response to augmentation.  Basilic Vein: No evidence of thrombus. Normal compressibility, respiratory phasicity and response to augmentation.  Brachial Veins: No evidence of thrombus. Normal compressibility, respiratory phasicity and response to augmentation.  Radial Veins: No evidence of thrombus. Normal compressibility, respiratory phasicity and response to augmentation.  Ulnar Veins: No evidence of thrombus. Normal compressibility, respiratory phasicity and response to augmentation.  Venous Reflux:  None visualized.  Other Findings:  None visualized.  IMPRESSION: No evidence of deep venous thrombosis in the LEFT upper extremity.   Electronically Signed   By: [name] M.D.   On: 03/26/2014 18:33      Medications: I have reviewed the patient's current  medications.  Impression: 1. Acute left arm cellulitis, improving. 2. Previous history of breast cancer with bilateral mastectomies. 3. Chronic acquired lymphedema secondary to #2. 4. COPD/asthma, stable. 5. Obesity.     Plan: 1. Discontinue IV fluids. 2. Continue intravenous antibiotics for another 24 hours. 3. If she continues improvement, she can be discharged tomorrow on oral antibiotics for another week to 10 days. Consultants:  None.   Procedures:  None.   Antibiotics:  Intravenous vancomycin and intravenous Zosyn started today.                   Code Status: Full code.  Family Communication: I discussed the plan with patient at the bedside.   Disposition Plan: Home when medically stable.  Time spent: 15 minutes.   LOS: 2 days   [name] C [name]   03/28/2014, 8:41 AM

## 2014-03-29 LAB — CBC
HCT: 32.8 % — ABNORMAL LOW (ref 36.0–46.0)
HEMOGLOBIN: 10.9 g/dL — AB (ref 12.0–15.0)
MCH: 26.2 pg (ref 26.0–34.0)
MCHC: 33.2 g/dL (ref 30.0–36.0)
MCV: 78.8 fL (ref 78.0–100.0)
Platelets: 227 10*3/uL (ref 150–400)
RBC: 4.16 MIL/uL (ref 3.87–5.11)
RDW: 15.6 % — AB (ref 11.5–15.5)
WBC: 2 10*3/uL — ABNORMAL LOW (ref 4.0–10.5)

## 2014-03-29 LAB — COMPREHENSIVE METABOLIC PANEL
ALBUMIN: 2.8 g/dL — AB (ref 3.5–5.2)
ALT: 145 U/L — ABNORMAL HIGH (ref 0–35)
AST: 165 U/L — ABNORMAL HIGH (ref 0–37)
Alkaline Phosphatase: 42 U/L (ref 39–117)
BUN: 6 mg/dL (ref 6–23)
CALCIUM: 9.2 mg/dL (ref 8.4–10.5)
CO2: 27 mEq/L (ref 19–32)
CREATININE: 0.69 mg/dL (ref 0.50–1.10)
Chloride: 101 mEq/L (ref 96–112)
GFR calc non Af Amer: >90 mL/min (ref >90)
GLUCOSE: 104 mg/dL — AB (ref 70–99)
Potassium: 3.6 mEq/L — ABNORMAL LOW (ref 3.7–5.3)
Sodium: 139 mEq/L (ref 137–147)
TOTAL PROTEIN: 6.6 g/dL (ref 6.0–8.3)
Total Bilirubin: 0.4 mg/dL (ref 0.3–1.2)

## 2014-03-29 MED ORDER — POTASSIUM CHLORIDE CRYS ER 20 MEQ PO TBCR
40.0000 meq | EXTENDED_RELEASE_TABLET | Freq: Once | ORAL | Status: AC
  Administered 2014-03-29: 40 meq via ORAL
  Filled 2014-03-29: qty 2

## 2014-03-29 MED ORDER — CEPHALEXIN 500 MG PO CAPS: 500.0000 mg | ORAL_CAPSULE | Freq: Four times a day (QID) | ORAL | Status: DC

## 2014-03-29 NOTE — Discharge Summary (Signed)
NAMEADALAE, [name]                ACCOUNT NO.:  [id number]  MEDICAL RECORD NO.:  [id number]  LOCATION:  Q676                          FACILITY:  [facility]  PHYSICIAN:  Paula Compton. Willey Blade, MD       DATE OF BIRTH:  [birth year]  DATE OF ADMISSION:  03/26/2014 DATE OF DISCHARGE:  05/02/2015LH                              DISCHARGE SUMMARY   DISCHARGE DIAGNOSES: 1. Left arm cellulitis. 2. Chronic lymphedema. 3. History of bilateral mastectomies for bilateral breast cancer. 4. Rheumatoid arthritis. 5. Hyperlipidemia. 6. Leukopenia. 7. Abnormal liver function studies.  DISCHARGE MEDICATIONS: 1. Cephalexin 500 mg q.i.d. 2. Methotrexate 2.5 mg weekly. 3. Folic acid 1 mg daily. 4. Pravastatin 40 mg daily. 5. Ferrous sulfate 325 mg daily. 6. Albuterol 2 puffs q.4 p.r.n. 7. Albuterol nebulizer 1 ampule b.i.d. 8. Hydrochlorothiazide 25 mg daily. 9. Acetaminophen 650 mg q.6 p.r.n. 10.Alka-Seltzer cold Plus 1 tablet daily as needed. 11.Naproxen 440 mg daily as needed. 12.Diphenhydramine 25 mg daily as needed.  HOSPITAL COURSE:  This patient is a 53 year old female who presented with redness, swelling, and pain in the left arm.  She was treated for cellulitis with vancomycin and Zosyn.  She had an ultrasound revealing no DVT, changes of cellulitis gradually improved.  She was felt to be stable for discharge on the morning of Mar 29, 2014.  She developed mild hypokalemia at 3.6, and was treated with supplemental potassium.  She was hypoalbuminemic at 2.8.  She was found to have elevated transaminases with an AST of 165 and ALT of 145 and was asked to have these repeated next week at her followup visit with Dr. Anastasio Champion.  She also developed a leukopenia.  Her white count dropped from 5.7-2.0. Antibiotic coverage is being modified from vancomycin and Zosyn to oral cephalexin.  Condition at discharge is much improved.  FOLLOWUP:  The patient was asked to follow up with Dr. Anastasio Champion in 3 days.   She was encouraged to elevate her left arm.     Paula Compton. Willey Blade, MD     ROF/MEDQ  D:  03/29/2014  T:  03/29/2014  Job:  [id number]

## 2014-03-29 NOTE — Progress Notes (Signed)
Pt is to be discharged home today. Pt is in NAD, IV is out, all paperwork has been reviewed/discussed with patient, and there are no questions/concerns at this time. Assessment is unchanged from this morning. Pt is to be accompanied downstairs by staff and family via wheelchair.  

## 2014-06-10 ENCOUNTER — Encounter (HOSPITAL_COMMUNITY): Payer: Self-pay | Admitting: Oncology

## 2014-06-10 DIAGNOSIS — Z1501 Genetic susceptibility to malignant neoplasm of breast: Secondary | ICD-10-CM

## 2014-06-10 DIAGNOSIS — Z90722 Acquired absence of ovaries, bilateral: Secondary | ICD-10-CM

## 2014-06-10 DIAGNOSIS — Z15068 Genetic susceptibility to other malignant neoplasm of digestive system: Secondary | ICD-10-CM | POA: Insufficient documentation

## 2014-06-10 DIAGNOSIS — Z1509 Genetic susceptibility to other malignant neoplasm: Secondary | ICD-10-CM

## 2014-06-10 DIAGNOSIS — C50911 Malignant neoplasm of unspecified site of right female breast: Secondary | ICD-10-CM | POA: Insufficient documentation

## 2014-06-10 HISTORY — DX: Genetic susceptibility to malignant neoplasm of breast: Z15.09

## 2014-06-10 HISTORY — DX: Acquired absence of ovaries, bilateral: Z90.722

## 2014-06-10 HISTORY — DX: Malignant neoplasm of unspecified site of right female breast: C50.911

## 2014-06-10 HISTORY — DX: Genetic susceptibility to malignant neoplasm of breast: Z15.01

## 2014-06-10 NOTE — Progress Notes (Signed)
-  No show-  KEFALAS,THOMAS 06/13/2014

## 2014-06-13 ENCOUNTER — Ambulatory Visit (HOSPITAL_COMMUNITY): Payer: 59 | Admitting: Oncology

## 2014-07-01 NOTE — Progress Notes (Signed)
Robert Bellow, MD [place] [address]  Adenocarcinoma of left breast  Infiltrating ductal carcinoma of right breast  CURRENT THERAPY: Surveillance per NCCN guidelines  INTERVAL HISTORY: [name] 53 y.o. female returns for  regular  visit for followup of: 1. Stage III adenocarcinoma of the left breast 9.5 cm in size with 13/24 positive nodes, focal extracapsular extension in several nodes with LVI. Several nodes were greater than 3 cm. She was treated with surgery on 12/03/1999 followed by [facility] followed by Taxol and remains disease free. Her cancer was triple negative.  2. Right-sided breast cancer stage IIA September 1993 with a T2 N0 M0 lesion treated with mastectomy followed by CIF for 6 cycles. ER receptors were 10f/mg cytosol protein. PR receptors 23. Cancer was diploid. S-phase fraction was very low at 0.4%. 12 nodes were negative. She was treated with tamoxifen after the chemotherapy.  3. BRAC1 positivity in her mother who has had breast cancer. Another sister died of breast cancer at 370 She has another sister who has had breast cancer, may have recurrent disease.  I personally reviewed and went over laboratory results with the patient.  The results are noted within this dictation.  She reports that she was recently in the hospital a few months ago.  Her chart is reviewed and she was diagnosed and treated with a left arm cellulitis.  It is cleared now.  She reports that she was diagnosed and is under active treatment with methotrexate for rheumatoid arthritis.  She reports this is managed by her primary care provider.  I do not have proof of this at this time, but will defer to her treating physician.  She reports that she has not been tested for BRCA.  This has been recommended to her, but she reports that it is cost prohibitive.  I recommended her children be tested if she is unable.  I have encouraged her to share this information with her  children.  I hope one day they can all be tested.  She asked about cancer marker testing and I do not think this is a good option at this time.  I provided her education that cancer markers are poor screening tools for malignancy.  She will be a grandmother in the near future, and again, another reason to undergo genetic testing.  Oncologically, she denies any complaints and ROS questioning is negative.   Past Medical History  Diagnosis Date  . Breast cancer     double mastectomy 1990 then 2002  . Asthma   . COPD (chronic obstructive pulmonary disease)   . Anemia   . High cholesterol   . Chronic acquired lymphedema   . Adenocarcinoma of left breast     double mastectomy 1990 then 2002   . Infiltrating ductal carcinoma of right breast 06/10/2014  . BRCA1 positive, suspected 06/10/2014    Mother is BRCA1 +, sister died at 347from breast cancer, another sister had breast cancer too.  . S/P bilateral salpingo-oophorectomy 06/10/2014    August 2010  . Arthritis     rheumatoid    has Cellulitis; Nausea and vomiting; COPD (chronic obstructive pulmonary disease); Arthritis; Anemia; High cholesterol; Adenocarcinoma of left breast; Cellulitis of left arm; Chronic acquired lymphedema; Infiltrating ductal carcinoma of right breast; BRCA1 positive, suspected; and S/P bilateral salpingo-oophorectomy on her problem list.     has No Known Allergies.  Ms. SMatousekhad no medications administered during this visit.  Past Surgical History  Procedure Laterality Date  . Double mastectomy    . Abdominal hysterectomy      complete hysterectomy  . Mastectomy    . Breast surgery      Denies any headaches, dizziness, double vision, fevers, chills, night sweats, nausea, vomiting, diarrhea, constipation, chest pain, heart palpitations, shortness of breath, blood in stool, black tarry stool, urinary pain, urinary burning, urinary frequency, hematuria.   PHYSICAL EXAMINATION  ECOG PERFORMANCE STATUS: 0 -  Asymptomatic  Filed Vitals:   07/03/14 1140  BP: 113/69  Pulse: 92  Temp: 97.9 F (36.6 C)  Resp: 18    GENERAL:alert, no distress, well nourished, well developed, comfortable, cooperative, obese and smiling SKIN: skin color, texture, turgor are normal, no rashes or significant lesions HEAD: Normocephalic, No masses, lesions, tenderness or abnormalities EYES: normal, PERRLA, EOMI, Conjunctiva are pink and non-injected EARS: External ears normal OROPHARYNX:mucous membranes are moist  NECK: supple, no adenopathy, thyroid normal size, non-tender, without nodularity, no stridor, non-tender, trachea midline LYMPH:  no palpable lymphadenopathy, no hepatosplenomegaly BREAST:post-mastectomy site well healed and free of suspicious changes LUNGS: clear to auscultation and percussion HEART: regular rate & rhythm, no murmurs, no gallops, S1 normal and S2 normal ABDOMEN:abdomen soft, non-tender, obese, normal bowel sounds, no masses or organomegaly and no hepatosplenomegaly BACK: Back symmetric, no curvature., No CVA tenderness EXTREMITIES:less then 2 second capillary refill, no skin discoloration, no clubbing, no cyanosis, positive findings:  Bilateral UE lymphedema  NEURO: alert & oriented x 3 with fluent speech, no focal motor/sensory deficits, gait normal   LABORATORY DATA: CBC    Component Value Date/Time   WBC 2.0* 03/29/2014 0551   RBC 4.16 03/29/2014 0551   RBC 4.10 03/27/2014 0940   HGB 10.9* 03/29/2014 0551   HCT 32.8* 03/29/2014 0551   PLT 227 03/29/2014 0551   MCV 78.8 03/29/2014 0551   MCH 26.2 03/29/2014 0551   MCHC 33.2 03/29/2014 0551   RDW 15.6* 03/29/2014 0551   LYMPHSABS 1.0 03/26/2014 1118   MONOABS 0.3 03/26/2014 1118   EOSABS 0.0 03/26/2014 1118   BASOSABS 0.0 03/26/2014 1118      Chemistry      Component Value Date/Time   NA 139 03/29/2014 0551   K 3.6* 03/29/2014 0551   CL 101 03/29/2014 0551   CO2 27 03/29/2014 0551   BUN 6 03/29/2014 0551   CREATININE 0.69 03/29/2014 0551        Component Value Date/Time   CALCIUM 9.2 03/29/2014 0551   ALKPHOS 42 03/29/2014 0551   AST 165* 03/29/2014 0551   ALT 145* 03/29/2014 0551   BILITOT 0.4 03/29/2014 0551     No results found for this basename: LABCA2   No results found for this basename: CEA   Lab Results  Component Value Date   CA125 18.4 06/10/2009      ASSESSMENT:  1. Stage III adenocarcinoma of the left breast 9.5 cm in size with 13/24 positive nodes, focal extracapsular extension in several nodes with LVI. Several nodes were greater than 3 cm. She was treated with surgery on 12/03/1999 followed by [facility] followed by Taxol and remains disease free. Her cancer was triple negative.  2. Right-sided breast cancer stage IIA September 1993 with a T2 N0 M0 lesion treated with mastectomy followed by CIF for 6 cycles. ER receptors were 21f/mg cytosol protein. PR receptors 23. Cancer was diploid. S-phase fraction was very low at 0.4%. 12 nodes were negative. She was treated with tamoxifen after the chemotherapy.  3. BRAC1 positivity  in her mother who has had breast cancer. Another sister died of breast cancer at 96. She has another sister who has had breast cancer, may have recurrent disease. 4. S/P Bilateral salingo-oophorectomy  Patient Active Problem List   Diagnosis Date Noted  . Infiltrating ductal carcinoma of right breast 06/10/2014  . BRCA1 positive, suspected 06/10/2014  . S/P bilateral salpingo-oophorectomy 06/10/2014  . Cellulitis 03/26/2014  . Nausea and vomiting 03/26/2014  . Cellulitis of left arm 03/26/2014  . Chronic acquired lymphedema 03/26/2014  . COPD (chronic obstructive pulmonary disease)   . Arthritis   . Anemia   . High cholesterol   . Adenocarcinoma of left breast     PLAN:  1. I personally reviewed and went over laboratory results with the patient.  The results are noted within this dictation. 2. Recommended BRCA testing for her and/or her daughters 3. Return in 12 months for follow-up   THERAPY  PLAN:  Return in 12 months for follow-up.  All questions were answered. The patient knows to call the clinic with any problems, questions or concerns. We can certainly see the patient much sooner if necessary.  Patient and plan discussed with Dr. Farrel Gobble and he is in agreement with the aforementioned.   Jaysean Manville 07/03/2014

## 2014-07-03 ENCOUNTER — Encounter (HOSPITAL_COMMUNITY): Payer: Medicare Other | Attending: Oncology | Admitting: Oncology

## 2014-07-03 ENCOUNTER — Encounter (HOSPITAL_COMMUNITY): Payer: Self-pay | Admitting: Oncology

## 2014-07-03 VITALS — BP 113/69 | HR 92 | Temp 97.9°F | Resp 18 | Wt 174.0 lb

## 2014-07-03 DIAGNOSIS — C50911 Malignant neoplasm of unspecified site of right female breast: Secondary | ICD-10-CM

## 2014-07-03 DIAGNOSIS — C50912 Malignant neoplasm of unspecified site of left female breast: Secondary | ICD-10-CM

## 2014-07-03 DIAGNOSIS — C50919 Malignant neoplasm of unspecified site of unspecified female breast: Secondary | ICD-10-CM

## 2014-07-03 NOTE — Patient Instructions (Signed)
[facility] Discharge Instructions  RECOMMENDATIONS MADE BY THE CONSULTANT AND ANY TEST RESULTS WILL BE SENT TO YOUR REFERRING PHYSICIAN.  Return in one year for office visit. Consider BRCA testing for yourself and/or your daughters as discussed with Tom.  Thank you for choosing [facility] to provide your oncology and hematology care.  To afford each patient quality time with our providers, please arrive at least 15 minutes before your scheduled appointment time.  With your help, our goal is to use those 15 minutes to complete the necessary work-up to ensure our physicians have the information they need to help with your evaluation and healthcare recommendations.    Effective January 1st, 2014, we ask that you re-schedule your appointment with our physicians should you arrive 10 or more minutes late for your appointment.  We strive to give you quality time with our providers, and arriving late affects you and other patients whose appointments are after yours.    Again, thank you for choosing [facility].  Our hope is that these requests will decrease the amount of time that you wait before being seen by our physicians.       _____________________________________________________________  Should you have questions after your visit to [facility], please contact our office at [phone] between the hours of 8:30 a.m. and 4:30 p.m.  Voicemails left after 4:30 p.m. will not be returned until the following business day.  For prescription refill requests, have your pharmacy contact our office with your prescription refill request.    _______________________________________________________________  We hope that we have given you very good care.  You may receive a patient satisfaction survey in the mail, please complete it and return it as soon as possible.  We value your  feedback!  _______________________________________________________________  Have you asked about our STAR program?  STAR stands for Survivorship Training and Rehabilitation, and this is a nationally recognized cancer care program that focuses on survivorship and rehabilitation.  Cancer and cancer treatments may cause problems, such as, pain, making you feel tired and keeping you from doing the things that you need or want to do. Cancer rehabilitation can help. Our goal is to reduce these troubling effects and help you have the best quality of life possible.  You may receive a survey from a nurse that asks questions about your current state of health.  Based on the survey results, all eligible patients will be referred to the [facility] program for an evaluation so we can better serve you!  A frequently asked questions sheet is available upon request.

## 2014-07-07 ENCOUNTER — Ambulatory Visit (HOSPITAL_COMMUNITY): Payer: Medicare Other | Admitting: Oncology

## 2015-06-24 IMAGING — US US EXTREM  UP VENOUS*L*
1 series · 13 of 24 positions shown · non-contrast
Comparison: None

CLINICAL DATA: LEFT arm pain and swelling for 3 days question DVT,
cellulitis, history LEFT



[Series 1: us extrem up venous*left* · 0.05mm/px · 13 of 33 slices shown]
[im 1/33]
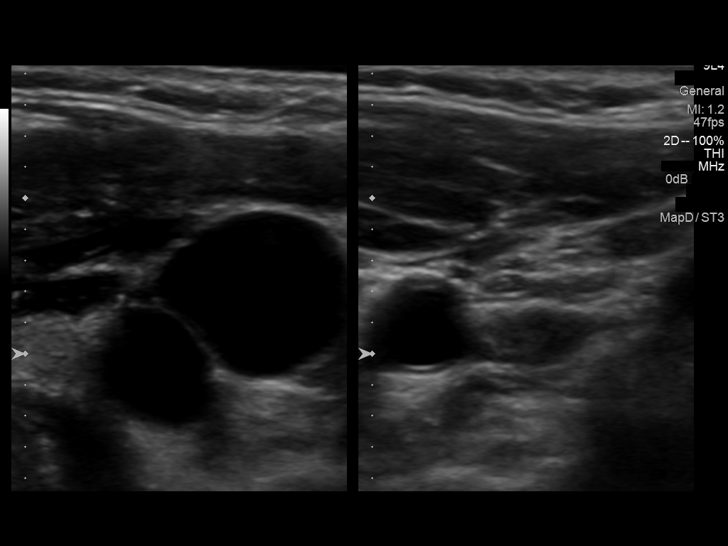
[im 3/33]
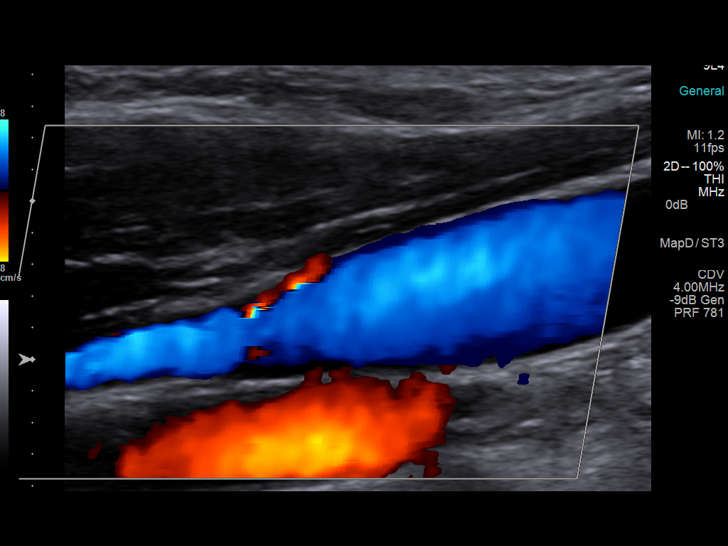
[im 6/33]
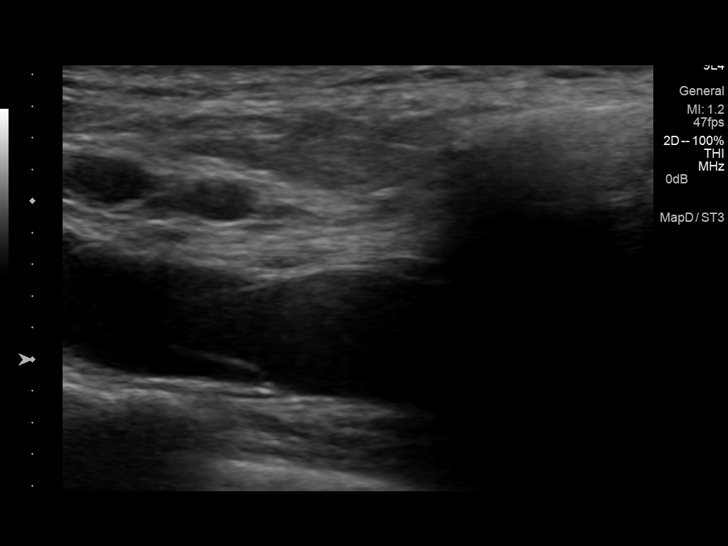
[im 9/33]
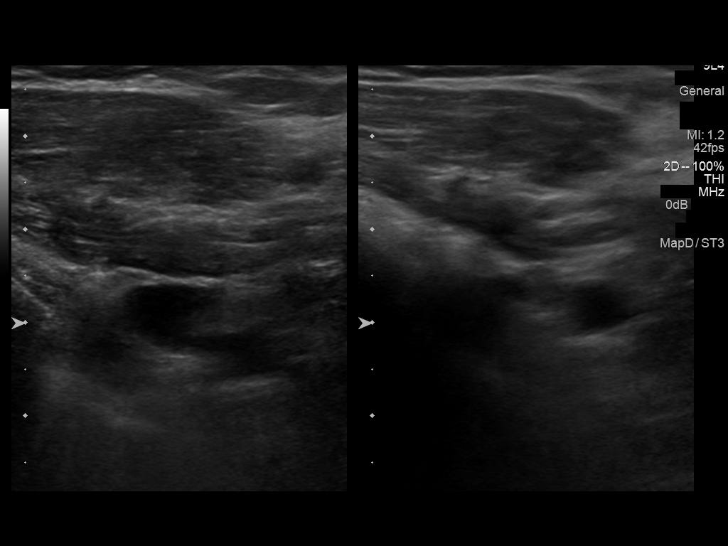
[im 12/33]
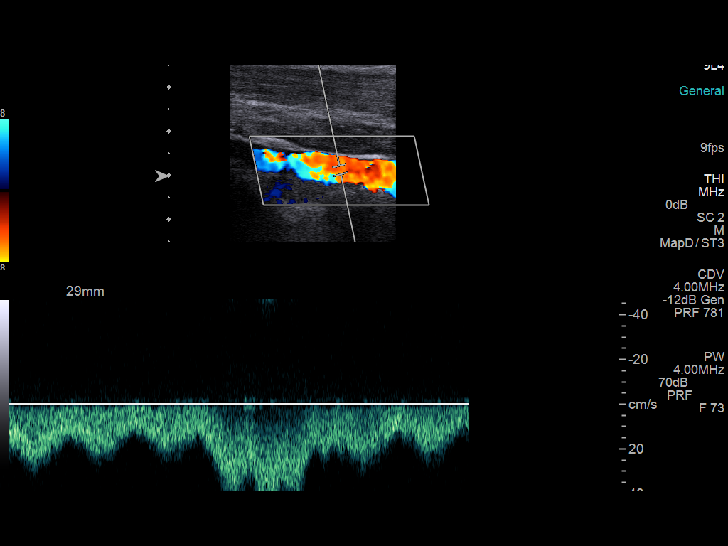
[im 14/33]
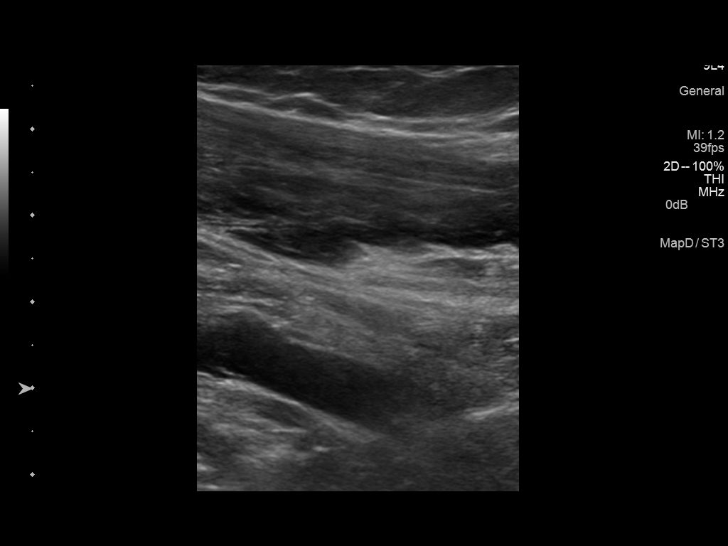
[im 17/33]
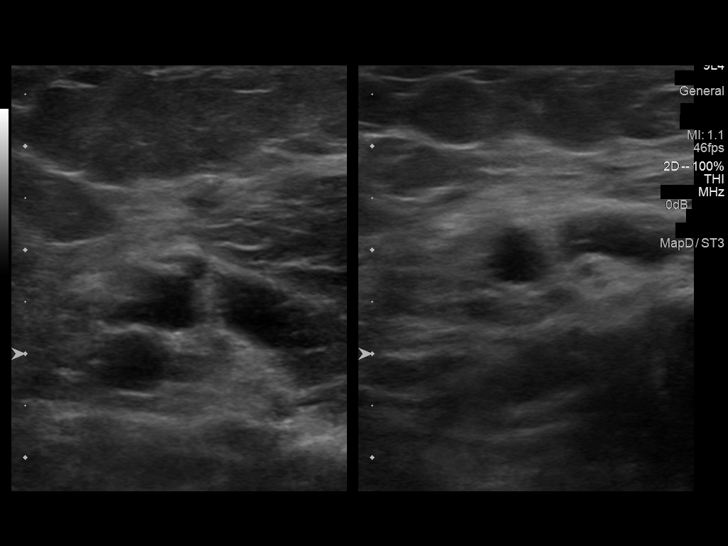
[im 19/33]
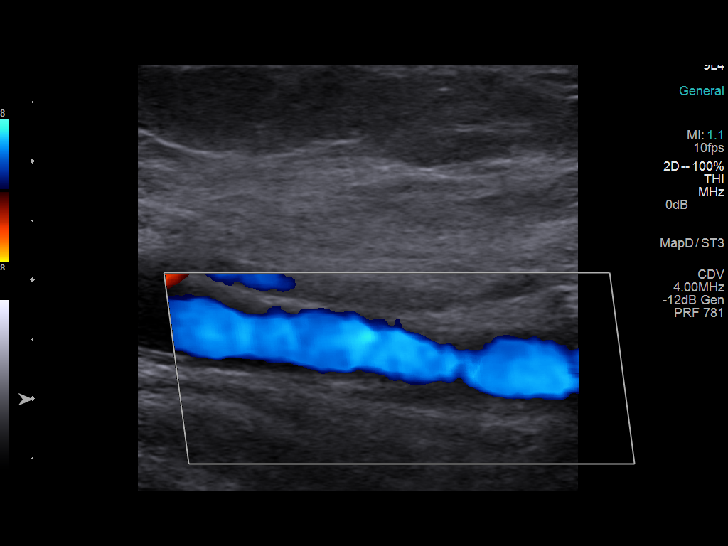
[im 21/33]
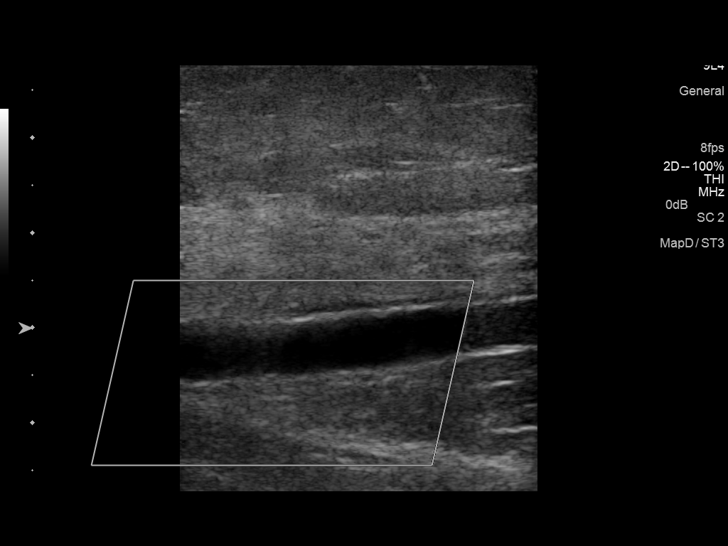
[im 24/33]
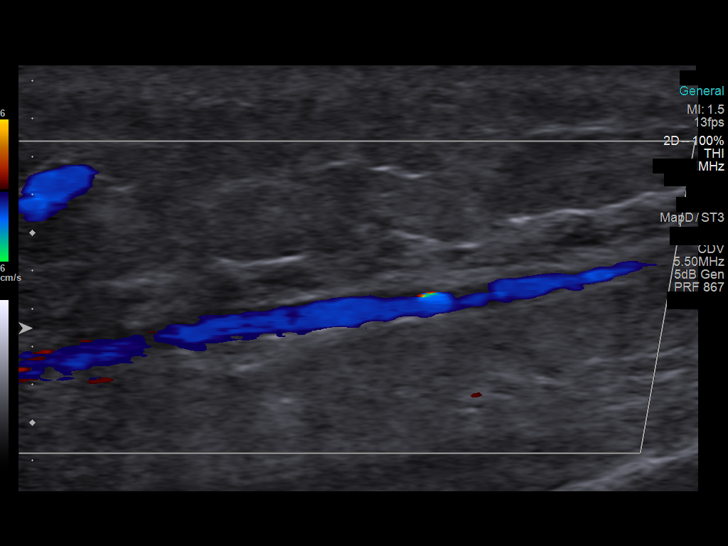
[im 27/33]
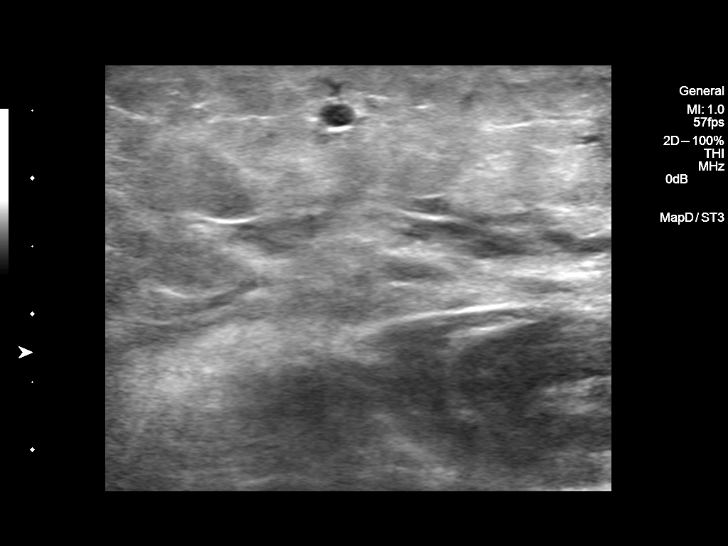
[im 30/33]
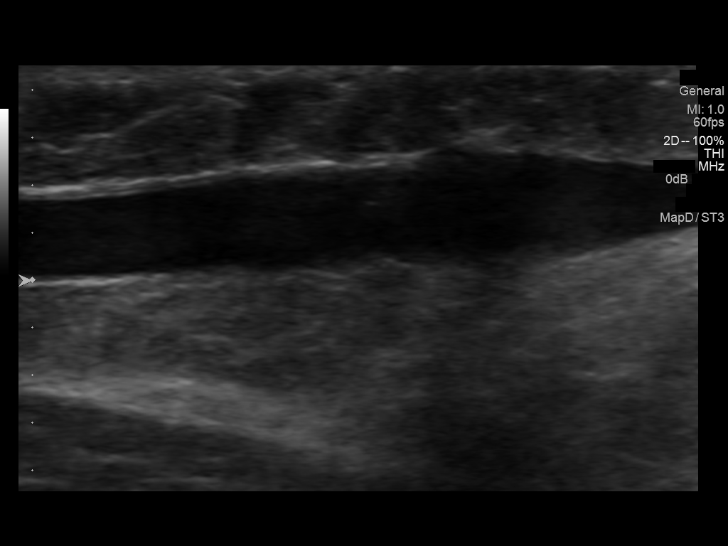
[im 33/33]
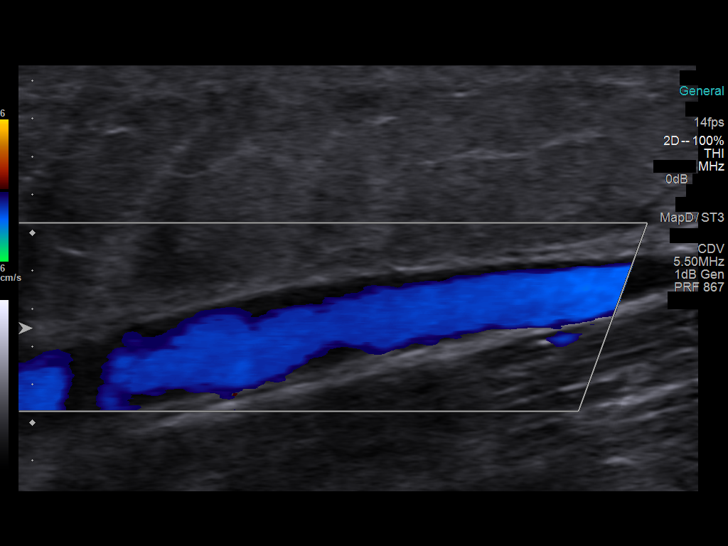

[13 of 24 positions shown; findings below may reference images not displayed]

FINDINGS: Internal Jugular Vein: No evidence of thrombus. Normal
compressibility, respiratory phasicity and response to augmentation.

Subclavian Vein: No evidence of thrombus. Normal compressibility,
respiratory phasicity and response to augmentation.

Axillary Vein: No evidence of thrombus. Normal compressibility,
respiratory phasicity and response to augmentation.

Cephalic Vein: No evidence of thrombus. Normal compressibility,
respiratory phasicity and response to augmentation.

Basilic Vein: No evidence of thrombus. Normal compressibility,
respiratory phasicity and response to augmentation.

Brachial Veins: No evidence of thrombus. Normal compressibility,
respiratory phasicity and response to augmentation.

Radial Veins: No evidence of thrombus. Normal compressibility,
respiratory phasicity and response to augmentation.

Ulnar Veins: No evidence of thrombus. Normal compressibility,
respiratory phasicity and response to augmentation.

Venous Reflux:  None visualized.

Other Findings:  None visualized.
IMPRESSION: No evidence of deep venous thrombosis in the LEFT upper extremity.

## 2015-07-07 ENCOUNTER — Ambulatory Visit (HOSPITAL_COMMUNITY): Payer: Medicare Other | Admitting: Oncology

## 2015-07-29 ENCOUNTER — Encounter (HOSPITAL_COMMUNITY): Payer: Self-pay | Admitting: Oncology

## 2015-07-29 ENCOUNTER — Encounter (HOSPITAL_COMMUNITY): Payer: Self-pay | Attending: Oncology | Admitting: Oncology

## 2015-07-29 VITALS — BP 130/75 | HR 87 | Temp 98.8°F | Resp 18 | Wt 185.0 lb

## 2015-07-29 DIAGNOSIS — Z803 Family history of malignant neoplasm of breast: Secondary | ICD-10-CM

## 2015-07-29 DIAGNOSIS — Z1501 Genetic susceptibility to malignant neoplasm of breast: Secondary | ICD-10-CM

## 2015-07-29 DIAGNOSIS — C50911 Malignant neoplasm of unspecified site of right female breast: Secondary | ICD-10-CM

## 2015-07-29 DIAGNOSIS — C50912 Malignant neoplasm of unspecified site of left female breast: Secondary | ICD-10-CM

## 2015-07-29 DIAGNOSIS — Z1509 Genetic susceptibility to other malignant neoplasm: Secondary | ICD-10-CM

## 2015-07-29 DIAGNOSIS — Z853 Personal history of malignant neoplasm of breast: Secondary | ICD-10-CM

## 2015-07-29 NOTE — Assessment & Plan Note (Addendum)
Stage III adenocarcinoma of the left breast 9.5 cm in size with 13/24 positive nodes, focal extracapsular extension in several nodes with LVI. Several nodes were greater than 3 cm. She was treated with surgery on 12/03/1999 followed by [facility] followed by Taxol and remains disease free. Her cancer was triple negative.   Genetic counseling is recommended.  She is educated that genetic counseling now comes to the [facility] for consultations.  I have strongly urged her to consider this consultation.  In the past it was cost-prohibitive for her.  I have asked if there are any funds available to this patient to pay for this important test.  I have messaged our Development worker, community and Education officer, museum.  I explained the importance of genetics counseling not only for the patient, but also her children and grandchildren.  Return in 12 months for follow-up.  No role for oncology labs at this time.

## 2015-07-29 NOTE — Patient Instructions (Signed)
..  [place] at [facility] Discharge Instructions  RECOMMENDATIONS MADE BY THE CONSULTANT AND ANY TEST RESULTS WILL BE SENT TO YOUR REFERRING PHYSICIAN.  Checking with our resources to see if there are funds available to help with genetic counseling   Return in 12 months  Thank you for choosing [place] at [facility] to provide your oncology and hematology care.  To afford each patient quality time with our provider, please arrive at least 15 minutes before your scheduled appointment time.    You need to re-schedule your appointment should you arrive 10 or more minutes late.  We strive to give you quality time with our providers, and arriving late affects you and other patients whose appointments are after yours.  Also, if you no show three or more times for appointments you may be dismissed from the clinic at the providers discretion.     Again, thank you for choosing [facility].  Our hope is that these requests will decrease the amount of time that you wait before being seen by our physicians.       _____________________________________________________________  Should you have questions after your visit to [facility], please contact our office at [phone] between the hours of 8:30 a.m. and 4:30 p.m.  Voicemails left after 4:30 p.m. will not be returned until the following business day.  For prescription refill requests, have your pharmacy contact our office.

## 2015-07-29 NOTE — Assessment & Plan Note (Signed)
BRAC1 positivity in her mother who has had breast cancer. Another sister died of breast cancer at 44. She has another sister who has had breast cancer, may have recurrent disease.  S/P Bilateral salingo-oophorectomy

## 2015-07-29 NOTE — Assessment & Plan Note (Addendum)
Right-sided breast cancer stage IIA September 1993 with a T2 N0 M0 lesion treated with mastectomy followed by CIF for 6 cycles. ER receptors were 83fm/mg cytosol protein. PR receptors 23. Cancer was diploid. S-phase fraction was very low at 0.4%. 12 nodes were negative. She was treated with tamoxifen after the chemotherapy.

## 2015-07-29 NOTE — Progress Notes (Signed)
Kellie Obey, MD [address] [place] [address]  Adenocarcinoma of left breast  Infiltrating ductal carcinoma of right breast  BRCA1 positive, suspected  CURRENT THERAPY: Surveillance per NCCN guidelines  INTERVAL HISTORY: [name] 54 y.o. female returns for  regular  visit for followup of: 1. Stage III adenocarcinoma of the left breast 9.5 cm in size with 13/24 positive nodes, focal extracapsular extension in several nodes with LVI. Several nodes were greater than 3 cm. She was treated with surgery on 12/03/1999 followed by [facility] followed by Taxol and remains disease free. Her cancer was triple negative.  2. Right-sided breast cancer stage IIA September 1993 with a T2 N0 M0 lesion treated with mastectomy followed by CIF for 6 cycles. ER receptors were 12fm/mg cytosol protein. PR receptors 23. Cancer was diploid. S-phase fraction was very low at 0.4%. 12 nodes were negative. She was treated with tamoxifen after the chemotherapy.  3. BRAC1 positivity in her mother who has had breast cancer. Another sister died of breast cancer at 74. She has another sister who has had breast cancer, may have recurrent disease. 4. Rheumatoid arthritis, on methotrexate  I personally reviewed and went over laboratory results with the patient.  The results are noted within this dictation.  She has not undergone genetics counseling due to costs.  She is interested, but admits that she cannot afford it.  I have recommended she contact her insurance company to see if they can provide her an estimate of this service.  Additionally, I have investigated for any funding assistance.  I discussed in detail the importance of genetic counseling for the patient.  She is agreeable that it is important, not only for her but also her children and grandchildren, but she cannot afford it.  She is insured under her [employer], but she admits that they spent a significnat amount of money on the premium  on her insurance leaving little left for co-pays and deductibles.   Her grand-daughter is 83 months old which is keeping her very active.   Past Medical History  Diagnosis Date  . Breast cancer     double mastectomy 1990 then 2002  . Asthma   . COPD (chronic obstructive pulmonary disease)   . Anemia   . High cholesterol   . Chronic acquired lymphedema   . Adenocarcinoma of left breast     double mastectomy 1990 then 2002   . Infiltrating ductal carcinoma of right breast 06/10/2014  . BRCA1 positive, suspected 06/10/2014    Mother is BRCA1 +, sister died at 88 from breast cancer, another sister had breast cancer too.  . S/P bilateral salpingo-oophorectomy 06/10/2014    August 2010  . Arthritis     rheumatoid    has Cellulitis; Nausea and vomiting; COPD (chronic obstructive pulmonary disease); Arthritis; Anemia; High cholesterol; Adenocarcinoma of left breast; Cellulitis of left arm; Chronic acquired lymphedema; Infiltrating ductal carcinoma of right breast; BRCA1 positive, suspected; and S/P bilateral salpingo-oophorectomy on her problem list.     has No Known Allergies.  [name] does not currently have medications on file.  Past Surgical History  Procedure Laterality Date  . Double mastectomy    . Abdominal hysterectomy      complete hysterectomy  . Mastectomy    . Breast surgery      Denies any headaches, dizziness, double vision, fevers, chills, night sweats, nausea, vomiting, diarrhea, constipation, chest pain, heart palpitations, shortness of breath, blood in stool, black tarry stool,  urinary pain, urinary burning, urinary frequency, hematuria.   PHYSICAL EXAMINATION  ECOG PERFORMANCE STATUS: 0 - Asymptomatic  Filed Vitals:   07/29/15 1428  BP: 130/75  Pulse: 87  Temp: 98.8 F (37.1 C)  Resp: 18    GENERAL:alert, no distress, well nourished, well developed, comfortable, cooperative, obese and smiling, unaccompanied SKIN: skin color, texture, turgor are  normal, no rashes or significant lesions HEAD: Normocephalic, No masses, lesions, tenderness or abnormalities EYES: normal, PERRLA, EOMI, Conjunctiva are pink and non-injected EARS: External ears normal OROPHARYNX:mucous membranes are moist  NECK: supple, no adenopathy, thyroid normal size, non-tender, without nodularity, no stridor, non-tender, trachea midline LYMPH:  no palpable lymphadenopathy, no hepatosplenomegaly BREAST:Bilateral post-mastectomy site well healed and free of suspicious changes LUNGS: clear to auscultation and percussion HEART: regular rate & rhythm, no murmurs, no gallops, S1 normal and S2 normal ABDOMEN:abdomen soft, non-tender, obese, normal bowel sounds, no masses or organomegaly and no hepatosplenomegaly BACK: Back symmetric, no curvature., No CVA tenderness EXTREMITIES:less then 2 second capillary refill, no skin discoloration, no clubbing, no cyanosis, positive findings:  Bilateral UE lymphedema  NEURO: alert & oriented x 3 with fluent speech, no focal motor/sensory deficits, gait normal   LABORATORY DATA: CBC    Component Value Date/Time   WBC 2.0* 03/29/2014 0551   RBC 4.16 03/29/2014 0551   RBC 4.10 03/27/2014 0940   HGB 10.9* 03/29/2014 0551   HCT 32.8* 03/29/2014 0551   PLT 227 03/29/2014 0551   MCV 78.8 03/29/2014 0551   MCH 26.2 03/29/2014 0551   MCHC 33.2 03/29/2014 0551   RDW 15.6* 03/29/2014 0551   LYMPHSABS 1.0 03/26/2014 1118   MONOABS 0.3 03/26/2014 1118   EOSABS 0.0 03/26/2014 1118   BASOSABS 0.0 03/26/2014 1118      Chemistry      Component Value Date/Time   NA 139 03/29/2014 0551   K 3.6* 03/29/2014 0551   CL 101 03/29/2014 0551   CO2 27 03/29/2014 0551   BUN 6 03/29/2014 0551   CREATININE 0.69 03/29/2014 0551      Component Value Date/Time   CALCIUM 9.2 03/29/2014 0551   ALKPHOS 42 03/29/2014 0551   AST 165* 03/29/2014 0551   ALT 145* 03/29/2014 0551   BILITOT 0.4 03/29/2014 0551         ASSESSMENT/PLAN:  Adenocarcinoma of left breast Stage III adenocarcinoma of the left breast 9.5 cm in size with 13/24 positive nodes, focal extracapsular extension in several nodes with LVI. Several nodes were greater than 3 cm. She was treated with surgery on 12/03/1999 followed by [facility] followed by Taxol and remains disease free. Her cancer was triple negative.   Genetic counseling is recommended.  She is educated that genetic counseling now comes to the [facility] for consultations.  I have strongly urged her to consider this consultation.  In the past it was cost-prohibitive for her.  I have asked if there are any funds available to this patient to pay for this important test.  I have messaged our Development worker, community and Education officer, museum.  I explained the importance of genetics counseling not only for the patient, but also her children and grandchildren.  Return in 12 months for follow-up.  No role for oncology labs at this time.  Infiltrating ductal carcinoma of right breast Right-sided breast cancer stage IIA September 1993 with a T2 N0 M0 lesion treated with mastectomy followed by CIF for 6 cycles. ER receptors were 95fm/mg cytosol protein. PR receptors 23. Cancer was diploid.  S-phase fraction was very low at 0.4%. 12 nodes were negative. She was treated with tamoxifen after the chemotherapy.   BRCA1 positive, suspected BRAC1 positivity in her mother who has had breast cancer. Another sister died of breast cancer at 59. She has another sister who has had breast cancer, may have recurrent disease.  S/P Bilateral salingo-oophorectomy     THERAPY PLAN:  Return in 12 months for follow-up.  All questions were answered. The patient knows to call the clinic with any problems, questions or concerns. We can certainly see the patient much sooner if necessary.  Patient and plan discussed with Dr. Farrel Gobble and he is in agreement with the aforementioned.    Dell Hurtubise 07/29/2015

## 2016-07-28 ENCOUNTER — Ambulatory Visit (HOSPITAL_COMMUNITY): Payer: Self-pay | Admitting: Oncology

## 2016-07-29 ENCOUNTER — Ambulatory Visit (HOSPITAL_COMMUNITY): Payer: Self-pay | Admitting: Hematology & Oncology

## 2016-07-29 ENCOUNTER — Ambulatory Visit (HOSPITAL_COMMUNITY): Payer: Self-pay | Admitting: Oncology

## 2016-09-29 ENCOUNTER — Encounter (HOSPITAL_COMMUNITY): Payer: 59 | Attending: Hematology & Oncology | Admitting: Hematology & Oncology

## 2016-09-29 ENCOUNTER — Encounter (HOSPITAL_COMMUNITY): Payer: Self-pay | Admitting: Hematology & Oncology

## 2016-09-29 VITALS — BP 127/85 | HR 75 | Temp 98.0°F | Resp 16 | Wt 182.2 lb

## 2016-09-29 DIAGNOSIS — I890 Lymphedema, not elsewhere classified: Secondary | ICD-10-CM | POA: Diagnosis not present

## 2016-09-29 DIAGNOSIS — Z1509 Genetic susceptibility to other malignant neoplasm: Secondary | ICD-10-CM

## 2016-09-29 DIAGNOSIS — M069 Rheumatoid arthritis, unspecified: Secondary | ICD-10-CM | POA: Diagnosis not present

## 2016-09-29 DIAGNOSIS — R079 Chest pain, unspecified: Secondary | ICD-10-CM | POA: Diagnosis not present

## 2016-09-29 DIAGNOSIS — Z1501 Genetic susceptibility to malignant neoplasm of breast: Secondary | ICD-10-CM

## 2016-09-29 DIAGNOSIS — C50912 Malignant neoplasm of unspecified site of left female breast: Secondary | ICD-10-CM

## 2016-09-29 DIAGNOSIS — Z90722 Acquired absence of ovaries, bilateral: Secondary | ICD-10-CM

## 2016-09-29 DIAGNOSIS — Z803 Family history of malignant neoplasm of breast: Secondary | ICD-10-CM

## 2016-09-29 DIAGNOSIS — C50911 Malignant neoplasm of unspecified site of right female breast: Secondary | ICD-10-CM

## 2016-09-29 DIAGNOSIS — Z853 Personal history of malignant neoplasm of breast: Secondary | ICD-10-CM

## 2016-09-29 NOTE — Patient Instructions (Addendum)
[place] at [facility] Discharge Instructions  RECOMMENDATIONS MADE BY THE CONSULTANT AND ANY TEST RESULTS WILL BE SENT TO YOUR REFERRING PHYSICIAN.  You saw Dr.Penland today.  Follow up in 1 year.  We will contact [facility] for your arm Lymphedema and let you know.  See Amy at checkout for appointments.  Thank you for choosing [place] at [facility] to provide your oncology and hematology care.  To afford each patient quality time with our provider, please arrive at least 15 minutes before your scheduled appointment time.   Beginning January 23rd 2017 lab work for the [facility] will be done in the  Main lab at [facility] on 1st floor. If you have a lab appointment with the [facility] please come in thru the  Main Entrance and check in at the main information desk  You need to re-schedule your appointment should you arrive 10 or more minutes late.  We strive to give you quality time with our providers, and arriving late affects you and other patients whose appointments are after yours.  Also, if you no show three or more times for appointments you may be dismissed from the clinic at the providers discretion.     Again, thank you for choosing [facility].  Our hope is that these requests will decrease the amount of time that you wait before being seen by our physicians.       _____________________________________________________________  Should you have questions after your visit to [facility], please contact our office at [phone] between the hours of 8:30 a.m. and 4:30 p.m.  Voicemails left after 4:30 p.m. will not be returned until the following business day.  For prescription refill requests, have your pharmacy contact our office.         Resources For Cancer Patients and their Caregivers ? American Cancer Society: Can assist with transportation, wigs, general needs, runs Look Good  Feel Better.        279-289-8756 ? Cancer Care: Provides financial assistance, online support groups, medication/co-pay assistance.  1-800-813-HOPE 684-364-5352) ? [place] Assists [place] cancer patients and their families through emotional , educational and financial support.  ([phone] ? [place] DSS Where to apply for food stamps, Medicaid and utility assistance. ([phone] ? RCATS: Transportation to medical appointments. 321-681-9767 ? Social Security Administration: May apply for disability if have a Stage IV cancer. 704-523-9814 ([phone] ? [employer], Disability and Transit Services: Assists with nutrition, care and transit needs. [place] Support Programs: @10RELATIVEDAYS @ > Cancer Support Group  2nd Tuesday of the month 1pm-2pm, Journey Room  > Creative Journey  3rd Tuesday of the month 1130am-1pm, Journey Room  > Look Good Feel Better  1st Wednesday of the month 10am-12 noon, Journey Room (Call [place] to register [phone])

## 2016-09-29 NOTE — Progress Notes (Signed)
Kellie Bellow, MD [address] [place] [address]  No diagnosis found.  CURRENT THERAPY: Surveillance per NCCN guidelines  INTERVAL HISTORY: [name] 55 y.o. female returns for  regular  visit for followup of: 1. Stage III adenocarcinoma of the left breast 9.5 cm in size with 13/24 positive nodes, focal extracapsular extension in several nodes with LVI. Several nodes were greater than 3 cm. She was treated with surgery on 12/03/1999 followed by [facility] followed by Taxol and remains disease free. Her cancer was triple negative.  2. Right-sided breast cancer stage IIA September 1993 with a T2 N0 M0 lesion treated with mastectomy followed by CIF for 6 cycles. ER receptors were 73f/mg cytosol protein. PR receptors 23. Cancer was diploid. S-phase fraction was very low at 0.4%. 12 nodes were negative. She was treated with tamoxifen after the chemotherapy.  3. BRAC1 positivity in her mother who has had breast cancer. Another sister died of breast cancer at 385 She has another sister who has had breast cancer, may have recurrent disease. 4. Rheumatoid arthritis, on methotrexate  [name] unaccompanied. She feels well. Normal appetite and bowels. Energy is fairly good. No new pain.   Patient has not had formal genetic testing, but her mother has. She cannot recall her mother's results. EKierahhas had a hysterectomy.   She experiences lymphedema and chest wall pain. Patient elevates arms with pillow to reduce swelling. She tried wrapping her arms and tried wearing a glove in the past but stopped because she felt it exacerbated swelling. Patient states lymphedema has not gotten worse. She has not tried lymphedema pump.   PCP is Dr. KKarie Kirks   She is up to date on her colonoscopy. She no longer needs mammograms since she has had bilateral mastectomies. Personal genetic testing was not pursued secondary to cost.   She does not need any refills.   Past Medical History:  Diagnosis  Date  . Adenocarcinoma of left breast ([place]    double mastectomy 1990 then 2002   . Anemia   . Arthritis    rheumatoid  . Asthma   . BRCA1 positive, suspected 06/10/2014   Mother is BRCA1 +, sister died at 360from breast cancer, another sister had breast cancer too.  . Breast cancer ([place]    double mastectomy 1990 then 2002  . Chronic acquired lymphedema   . COPD (chronic obstructive pulmonary disease) ([place]   . High cholesterol   . Infiltrating ductal carcinoma of right breast ([place] 06/10/2014  . S/P bilateral salpingo-oophorectomy 06/10/2014   August 2010    has Cellulitis; Nausea and vomiting; COPD (chronic obstructive pulmonary disease) ([place]; Arthritis; Anemia; High cholesterol; Adenocarcinoma of left breast ([place]; Cellulitis of left arm; Chronic acquired lymphedema; Infiltrating ductal carcinoma of right breast ([place]; BRCA1 positive, suspected; and S/P bilateral salpingo-oophorectomy on her problem list.     has No Known Allergies.  Ms. SBasinskidoes not currently have medications on file.  Past Surgical History:  Procedure Laterality Date  . ABDOMINAL HYSTERECTOMY     complete hysterectomy  . BREAST SURGERY    . double mastectomy    . MASTECTOMY      Denies any headaches, dizziness, double vision, fevers, chills, night sweats, nausea, vomiting, diarrhea, constipation, chest pain, heart palpitations, shortness of breath, blood in stool, black tarry stool, urinary pain, urinary burning, urinary frequency, hematuria.  PHYSICAL EXAMINATION  ECOG PERFORMANCE STATUS: 0 - Asymptomatic  Vitals:   09/29/16 1003  BP: 127/85  Pulse: 75  Resp: 16  Temp: 98 F (36.7 C)    GENERAL:alert, no distress, well nourished, well developed, comfortable, cooperative, obese and smiling, unaccompanied SKIN: skin color, texture, turgor are normal, no rashes or significant lesions HEAD: Normocephalic, No masses, lesions, tenderness or abnormalities EYES: normal, PERRLA, EOMI, Conjunctiva  are pink and non-injected EARS: External ears normal OROPHARYNX:mucous membranes are moist  NECK: supple, no adenopathy, thyroid normal size, non-tender, without nodularity, no stridor, non-tender, trachea midline LYMPH:  no palpable lymphadenopathy, no hepatosplenomegaly BREAST:Bilateral post-mastectomy site well healed and free of suspicious changes, no palpable abnormalities LUNGS: clear to auscultation and percussion HEART: regular rate & rhythm, no murmurs, no gallops, S1 normal and S2 normal ABDOMEN:abdomen soft, non-tender, obese, normal bowel sounds, no masses or organomegaly and no hepatosplenomegaly BACK: Back symmetric, no curvature., No CVA tenderness EXTREMITIES:less then 2 second capillary refill, no skin discoloration, no clubbing, no cyanosis, positive findings:  Bilateral UE lymphedema  NEURO: alert & oriented x 3 with fluent speech, no focal motor/sensory deficits, gait normal   LABORATORY DATA: CBC    Component Value Date/Time   WBC 2.0 (L) 03/29/2014 0551   RBC 4.16 03/29/2014 0551   HGB 10.9 (L) 03/29/2014 0551   HCT 32.8 (L) 03/29/2014 0551   PLT 227 03/29/2014 0551   MCV 78.8 03/29/2014 0551   MCH 26.2 03/29/2014 0551   MCHC 33.2 03/29/2014 0551   RDW 15.6 (H) 03/29/2014 0551   LYMPHSABS 1.0 03/26/2014 1118   MONOABS 0.3 03/26/2014 1118   EOSABS 0.0 03/26/2014 1118   BASOSABS 0.0 03/26/2014 1118      Chemistry      Component Value Date/Time   NA 139 03/29/2014 0551   K 3.6 (L) 03/29/2014 0551   CL 101 03/29/2014 0551   CO2 27 03/29/2014 0551   BUN 6 03/29/2014 0551   CREATININE 0.69 03/29/2014 0551      Component Value Date/Time   CALCIUM 9.2 03/29/2014 0551   ALKPHOS 42 03/29/2014 0551   AST 165 (H) 03/29/2014 0551   ALT 145 (H) 03/29/2014 0551   BILITOT 0.4 03/29/2014 0551        ASSESSMENT/PLAN: Family history of BRCA1 positivity NO personal history of genetic testing secondary to cost Bilateral Breast cancers Stage III adenocarcinoma  of the L breast Stage IIA carcinoma of the R breast  Bilateral salpingo-oophorectomy   THERAPY PLAN:  Lya appears well. I explained to patient there are some newer therapies available for lymphedema patients. I encouraged her to let me know if she would ever like to consider returning to lymphedema therapy. I have referred her to [facility] for lymphedema devices.   I recommended Tram get a flu shot.  RTC in 12 months for follow up.  NCCN guidelines recommends the following surveillance for invasive breast cancer:  A. History and Physical exam every 4-6 months for 5 years and then every 12 months.  B. Mammography every 12 months  C. Women on Tamoxifen: annual gynecologic assessment every 12 months if uterus is present.  D. Women on aromatase inhibitor or who experience ovarian failure secondary to treatment should have monitoring of bone health with a bone mineral density determination at baseline and periodically thereafter.  E. Assess and encourage adherence to adjuvant endocrine therapy.  F. Evidence suggests that active lifestyle and achieving and maintaining an ideal body weight (20-25 BMI) may lead to optimal breast cancer outcomes.  All questions were answered. The patient knows to call the clinic with any problems,  questions or concerns. We can certainly see the patient much sooner if necessary.  This document serves as a record of services personally performed by Ancil Linsey, MD. It was created on her behalf by Elmyra Ricks, a trained medical scribe. The creation of this record is based on the scribe's personal observations and the provider's statements to them. This document has been checked and approved by the attending provider.  I have reviewed the above documentation for accuracy and completeness and I agree with the above.  Molli Hazard, MD  09/29/2016

## 2016-10-23 ENCOUNTER — Encounter (HOSPITAL_COMMUNITY): Payer: Self-pay | Admitting: Hematology & Oncology

## 2016-10-24 ENCOUNTER — Telehealth (HOSPITAL_COMMUNITY): Payer: Self-pay | Admitting: *Deleted

## 2017-01-03 ENCOUNTER — Encounter: Payer: Self-pay | Admitting: Internal Medicine

## 2017-10-06 ENCOUNTER — Ambulatory Visit (HOSPITAL_COMMUNITY): Payer: 59 | Admitting: Oncology

## 2017-12-25 DIAGNOSIS — E7800 Pure hypercholesterolemia, unspecified: Secondary | ICD-10-CM | POA: Diagnosis not present

## 2017-12-25 DIAGNOSIS — Z853 Personal history of malignant neoplasm of breast: Secondary | ICD-10-CM | POA: Diagnosis not present

## 2017-12-25 DIAGNOSIS — I972 Postmastectomy lymphedema syndrome: Secondary | ICD-10-CM | POA: Diagnosis not present

## 2017-12-25 DIAGNOSIS — M069 Rheumatoid arthritis, unspecified: Secondary | ICD-10-CM | POA: Diagnosis not present

## 2018-01-02 DIAGNOSIS — M069 Rheumatoid arthritis, unspecified: Secondary | ICD-10-CM | POA: Diagnosis not present

## 2018-01-02 DIAGNOSIS — I972 Postmastectomy lymphedema syndrome: Secondary | ICD-10-CM | POA: Diagnosis not present

## 2018-01-02 DIAGNOSIS — Z79899 Other long term (current) drug therapy: Secondary | ICD-10-CM | POA: Diagnosis not present

## 2018-01-12 ENCOUNTER — Inpatient Hospital Stay (HOSPITAL_COMMUNITY): Payer: Medicare Other | Attending: Oncology | Admitting: Adult Health

## 2018-01-12 ENCOUNTER — Encounter (HOSPITAL_COMMUNITY): Payer: Self-pay | Admitting: Adult Health

## 2018-01-12 ENCOUNTER — Ambulatory Visit (HOSPITAL_COMMUNITY): Payer: BLUE CROSS/BLUE SHIELD | Admitting: Oncology

## 2018-01-12 VITALS — BP 126/72 | HR 72 | Temp 98.3°F | Resp 16 | Wt 187.3 lb

## 2018-01-12 DIAGNOSIS — Z853 Personal history of malignant neoplasm of breast: Secondary | ICD-10-CM | POA: Diagnosis not present

## 2018-01-12 DIAGNOSIS — G620 Drug-induced polyneuropathy: Secondary | ICD-10-CM | POA: Insufficient documentation

## 2018-01-12 DIAGNOSIS — N951 Menopausal and female climacteric states: Secondary | ICD-10-CM | POA: Insufficient documentation

## 2018-01-12 DIAGNOSIS — G6289 Other specified polyneuropathies: Secondary | ICD-10-CM

## 2018-01-12 DIAGNOSIS — C50911 Malignant neoplasm of unspecified site of right female breast: Secondary | ICD-10-CM

## 2018-01-12 DIAGNOSIS — I890 Lymphedema, not elsewhere classified: Secondary | ICD-10-CM | POA: Diagnosis not present

## 2018-01-12 DIAGNOSIS — R232 Flushing: Secondary | ICD-10-CM

## 2018-01-12 MED ORDER — GABAPENTIN 100 MG PO CAPS: 100.0000 mg | ORAL_CAPSULE | Freq: Three times a day (TID) | ORAL | 0 refills | Status: DC

## 2018-01-12 NOTE — Patient Instructions (Addendum)
[place] at [facility] Discharge Instructions  RECOMMENDATIONS MADE BY THE CONSULTANT AND ANY TEST RESULTS WILL BE SENT TO YOUR REFERRING PHYSICIAN.  You saw Kellie Craze, NP, today See Amy at checkout for appointments.   Thank you for choosing [place] at [facility] to provide your oncology and hematology care.  To afford each patient quality time with our provider, please arrive at least 15 minutes before your scheduled appointment time.    If you have a lab appointment with the [facility] please come in thru the  Main Entrance and check in at the main information desk  You need to re-schedule your appointment should you arrive 10 or more minutes late.  We strive to give you quality time with our providers, and arriving late affects you and other patients whose appointments are after yours.  Also, if you no show three or more times for appointments you may be dismissed from the clinic at the providers discretion.     Again, thank you for choosing [facility].  Our hope is that these requests will decrease the amount of time that you wait before being seen by our physicians.       _____________________________________________________________  Should you have questions after your visit to [facility], please contact our office at [phone] between the hours of 8:30 a.m. and 4:30 p.m.  Voicemails left after 4:30 p.m. will not be returned until the following business day.  For prescription refill requests, have your pharmacy contact our office.       Resources For Cancer Patients and their Caregivers ? American Cancer Society: Can assist with transportation, wigs, general needs, runs Look Good Feel Better.        [phone] ? Cancer Care: Provides financial assistance, online support groups, medication/co-pay assistance.  1-800-813-HOPE 912-729-3377) ? [place] Assists  [place] cancer patients and their families through emotional , educational and financial support.  929-592-9150 ? [place] DSS Where to apply for food stamps, Medicaid and utility assistance. (716)399-8377 ? RCATS: Transportation to medical appointments. 253-315-8342 ? Social Security Administration: May apply for disability if have a Stage IV cancer. [phone] (213)[phone] ? [employer], Disability and Transit Services: Assists with nutrition, care and transit needs. [place] Support Programs: @10RELATIVEDAYS @ > Cancer Support Group  2nd Tuesday of the month 1pm-2pm, Journey Room  > Creative Journey  3rd Tuesday of the month 1130am-1pm, Journey Room  > Look Good Feel Better  1st Wednesday of the month 10am-12 noon, Journey Room (Call [place] to register [phone])   [place] at [facility]  Discharge Instructions:  Call the [facility] in 2 or 3 weeks and let us know how the Gabapentin is working for you and if you are doing better.   Talk to your family doctor about a Dexa scan for your bones.  _______________________________________________________________  Thank you for choosing [place] at [facility] to provide your oncology and hematology care.  To afford each patient quality time with our providers, please arrive at least 15 minutes before your scheduled appointment.  You need to re-schedule your appointment if you arrive 10 or more minutes late.  We strive to give you quality time with our providers, and arriving late affects you and other patients whose appointments are after yours.  Also, if you no show three or more times for  appointments you may be dismissed from the clinic.  Again, thank you for choosing [place] at [facility] hope is that these requests will allow you access to exceptional care and in a timely  manner. _______________________________________________________________  If you have questions after your visit, please contact our office at [phone] between the hours of 8:30 a.m. and 5:00 p.m. Voicemails left after 4:30 p.m. will not be returned until the following business day. _______________________________________________________________  For prescription refill requests, have your pharmacy contact our office. _______________________________________________________________  Recommendations made by the consultant and any test results will be sent to your referring physician. _______________________________________________________________

## 2018-01-12 NOTE — Progress Notes (Signed)
[place] [place], [place] [address]   CLINIC:  Medical Oncology/Hematology  PCP:  Lemmie Evens, MD [place] [address] [phone]   REASON FOR VISIT:  Follow-up for Bilateral breast cancer (Stage III left breast-2000; Stage IIA right breast-1993)  CURRENT THERAPY: Observation    HISTORY OF PRESENT ILLNESS:  (From Dr. Donald Pore note on 09/29/16)      INTERVAL HISTORY:  [name] 57 y.o. female returns for follow-up for history of bilateral breast cancers.   It has been some time since we have seen her at the cancer center; last visit was in 09/2016.   Here today unaccompanied.    Overall, she tells me she has been feeling "okay."  Appetite and energy levels both 75%.  She continues to struggle with bilateral arm lymphedema.  She now has a lymphedema pump which she uses once daily.  She has not seen dramatic improvement in her symptoms.  "I am just learning to live with this."  She denies any new or concerning chest wall complaints today.  Denies any lumps, skin changes, or adenopathy.  She has chronic issues with her breathing and swelling in her feet/legs.  She has chronic arthritis to her hands, which is being managed by her PCP.  She has peripheral neuropathy to her fingertips and toes, this started shortly after she completed chemotherapy many years ago.  She has frequent hot flashes, both during the day and at bedtime.  The hot flashes affect her ability to sleep well.  She tells me she is never tried any medications for her neuropathy or her hot flashes.  Her husband, Lauran Romanski, is also a patient here.  She tells me he lost his insurance and does not have plans to come back and see [place] until he regains his insurance coverage.  She tells me he is doing "okay."  She states that they are doing the best they can to help take care of one another.    REVIEW OF SYSTEMS:  Review of Systems  Constitutional: Positive  for fatigue. Negative for chills and fever.  HENT:  Negative.   Eyes: Negative.   Cardiovascular: Positive for leg swelling.  Gastrointestinal: Negative.   Endocrine: Positive for hot flashes.  Genitourinary: Negative.  Negative for vaginal bleeding.   Musculoskeletal: Positive for arthralgias.       Upper extremity lymphedema bilaterally  Neurological: Positive for numbness.  Hematological: Negative.   Psychiatric/Behavioral: Positive for sleep disturbance.     PAST MEDICAL/SURGICAL HISTORY:  Past Medical History:  Diagnosis Date  . Adenocarcinoma of left breast ([facility])    double mastectomy 1990 then 2002   . Anemia   . Arthritis    rheumatoid  . Asthma   . BRCA1 positive, suspected 06/10/2014   Mother is BRCA1 +, sister died at 62 from breast cancer, another sister had breast cancer too.  . Breast cancer ([facility])    double mastectomy 1990 then 2002  . Chronic acquired lymphedema   . COPD (chronic obstructive pulmonary disease) ([facility])   . High cholesterol   . Infiltrating ductal carcinoma of right breast ([facility]) 06/10/2014  . S/P bilateral salpingo-oophorectomy 06/10/2014   August 2010   Past Surgical History:  Procedure Laterality Date  . ABDOMINAL HYSTERECTOMY     complete hysterectomy  . BREAST SURGERY    . double mastectomy    . MASTECTOMY       SOCIAL HISTORY:  Social History   Socioeconomic  History  . Marital status: Married    Spouse name: Not on file  . Number of children: Not on file  . Years of education: Not on file  . Highest education level: Not on file  Social Needs  . Financial resource strain: Not on file  . Food insecurity - worry: Not on file  . Food insecurity - inability: Not on file  . Transportation needs - medical: Not on file  . Transportation needs - non-medical: Not on file  Occupational History  . Not on file  Tobacco Use  . Smoking status: Never Smoker  . Smokeless tobacco: Never Used  Substance and Sexual Activity  . Alcohol use: No   . Drug use: No  . Sexual activity: Yes    Birth control/protection: Surgical  Other Topics Concern  . Not on file  Social History Narrative  . Not on file    FAMILY HISTORY:  Family History  Problem Relation Age of Onset  . Breast cancer Mother   . Breast cancer Sister   . Breast cancer Sister     CURRENT MEDICATIONS:  Outpatient Encounter Medications as of 01/12/2018  Medication Sig Note  . acetaminophen (TYLENOL) 325 MG tablet Take 650 mg by mouth every 6 (six) hours as needed for pain.   Marland Kitchen albuterol (ACCUNEB) 0.63 MG/3ML nebulizer solution Take 1 ampule by nebulization 2 (two) times daily.   Marland Kitchen albuterol (PROVENTIL HFA;VENTOLIN HFA) 108 (90 BASE) MCG/ACT inhaler Inhale 2 puffs into the lungs every 4 (four) hours as needed.    . Chlorphen-Phenyleph-ASA (ALKA-SELTZER PLUS COLD PO) Take 1 tablet by mouth as needed (pain).    . Ferrous Gluconate (IRON 27 PO) Take 27 mg by mouth daily.   . fluticasone (FLONASE) 50 MCG/ACT nasal spray  09/29/2016: Received from: External Pharmacy  . folic acid (FOLVITE) 1 MG tablet Take 1 mg by mouth daily.   . furosemide (LASIX) 40 MG tablet  09/29/2016: Received from: External Pharmacy  . hydrochlorothiazide (HYDRODIURIL) 25 MG tablet Take 25 mg by mouth daily.   Marland Kitchen ibuprofen (ADVIL,MOTRIN) 200 MG tablet Take 200 mg by mouth every 6 (six) hours as needed for mild pain.   . methotrexate (RHEUMATREX) 2.5 MG tablet Take 2.5 mg by mouth once a week. Caution:Chemotherapy. Protect from light. (takes 4 tablets weekly)   . naproxen sodium (ANAPROX) 220 MG tablet Take 220 mg by mouth as needed.   . pravastatin (PRAVACHOL) 40 MG tablet Take 40 mg by mouth daily.   . predniSONE (DELTASONE) 10 MG tablet take 1 tablet by mouth once daily FOR SYMPTOMS OF RHEUMATOID ARTHRITIS 09/29/2016: Received from: External Pharmacy  . gabapentin (NEURONTIN) 100 MG capsule Take 1 capsule (100 mg total) by mouth 3 (three) times daily. Take 1 cap daily on day 1. Then take 1 cap BID  on day 2. Then take 1 cap TID on days 3 and thereafter.   . [DISCONTINUED] gabapentin (NEURONTIN) 100 MG capsule Take 1 capsule (100 mg total) by mouth 3 (three) times daily. Take 1 cap daily on day 1. Then take 1 cap BID on day 2. Then take 1 cap TID on days 3 and thereafter.    No facility-administered encounter medications on file as of 01/12/2018.     ALLERGIES:  No Known Allergies   PHYSICAL EXAM:  ECOG Performance status: 1 - Symptomatic   Vitals:   01/12/18 0901  BP: 126/72  Pulse: 72  Resp: 16  Temp: 98.3 F (36.8 C)  SpO2: 100%  Filed Weights   01/12/18 0901  Weight: 187 lb 4.8 oz (85 kg)    Physical Exam  Constitutional: She is oriented to person, place, and time and well-developed, well-nourished, and in no distress.  HENT:  Head: Normocephalic.  Mouth/Throat: Oropharynx is clear and moist. No oropharyngeal exudate.  Eyes: Conjunctivae are normal. Pupils are equal, round, and reactive to light. No scleral icterus.  Neck: Normal range of motion. Neck supple.  Cardiovascular: Normal rate and regular rhythm.  Pulmonary/Chest: Effort normal and breath sounds normal. No respiratory distress.    Abdominal: Soft. Bowel sounds are normal. There is no tenderness.  Musculoskeletal: Normal range of motion. She exhibits edema (Significant bilat arm lymphedema. (L) slightly > (R) arm).  Trace ankle edema bilat   Lymphadenopathy:    She has no cervical adenopathy.       Right: No supraclavicular adenopathy present.       Left: No supraclavicular adenopathy present.  Neurological: She is alert and oriented to person, place, and time. No cranial nerve deficit. Gait normal.  Skin: Skin is warm and dry. No rash noted.  Psychiatric: Mood, memory, affect and judgment normal.  Nursing note and vitals reviewed.    LABORATORY DATA:  I have reviewed the labs as listed.  CBC    Component Value Date/Time   WBC 2.0 (L) 03/29/2014 0551   RBC 4.16 03/29/2014 0551   HGB 10.9  (L) 03/29/2014 0551   HCT 32.8 (L) 03/29/2014 0551   PLT 227 03/29/2014 0551   MCV 78.8 03/29/2014 0551   MCH 26.2 03/29/2014 0551   MCHC 33.2 03/29/2014 0551   RDW 15.6 (H) 03/29/2014 0551   LYMPHSABS 1.0 03/26/2014 1118   MONOABS 0.3 03/26/2014 1118   EOSABS 0.0 03/26/2014 1118   BASOSABS 0.0 03/26/2014 1118   CMP Latest Ref Rng & Units 03/29/2014 03/27/2014 03/26/2014  Glucose 70 - 99 mg/dL 104(H) 92 100(H)  BUN 6 - 23 mg/dL '6 12 12  '$ Creatinine 0.50 - 1.10 mg/dL 0.69 0.73 0.76  Sodium 137 - 147 mEq/L 139 138 140  Potassium 3.7 - 5.3 mEq/L 3.6(L) 3.8 3.7  Chloride 96 - 112 mEq/L 101 102 101  CO2 19 - 32 mEq/L '27 26 29  '$ Calcium 8.4 - 10.5 mg/dL 9.2 9.1 9.7  Total Protein 6.0 - 8.3 g/dL 6.6 - -  Total Bilirubin 0.3 - 1.2 mg/dL 0.4 - -  Alkaline Phos 39 - 117 U/L 42 - -  AST 0 - 37 U/L 165(H) - -  ALT 0 - 35 U/L 145(H) - -    PENDING LABS:    DIAGNOSTIC IMAGING:    PATHOLOGY:     ASSESSMENT & PLAN:   Bilateral breast cancers:   *Stage III adenocarcinoma of (L) breast; ER-/PR-/HER2-:    -Diagnosed in 2000. Treated with (L) mastectomy with ALND. Treated with adjuvant chemo with Adriamycin/Cytoxan, followed by Taxol.    *Stage IIA (R) breast cancer; ER+:    -Diagnosed in 1993. Treated with (R) mastectomy with ALND. Treated with adjuvant chemo with CIF x 6 cycles. Received anti-estrogen therapy with Tamoxifen after chemotherapy.   -History of BRCA1 positivity in her mother; pt did not have formal genetic testing d/t cost concerns. She has undergone hysterectomy.   -Clinical breast exam performed today and negative for recurrent disease.  -No role for mammogram given bilateral mastectomies.   -We discussed the option of "graduating" from follow-up at the cancer center. Her last breast cancer was nearly 19 years ago; she has  no signs/symptoms concerning for recurrent disease. This is very favorable.  She wants to think about the option of being released from follow-up at the  cancer center. Shared with her that we are happy to continue to see her annually as long as she wants to continue to be seen here for follow-up.  For now, we will plan to see her in 1 year's time. She agrees with this plan.  -Return to cancer center in 1 year for follow-up.     Bone health:  -We have no records of DEXA imaging. Given that her breast cancer history is remote, I recommended she talk to her PCP about their recommendations for continued bone density surveillance.  We discussed the role estrogen plays in bone strength, particularly for breast cancer survivors and in women who are post-menopausal. She is certainly at risk for decreased bone density over time.  She will talk to her PCP about this at her next visit.  -Recommended calcium/vitamin D supplementation, if deemed appropriate by her PCP for bone health.     Bilateral arm lymphedema:  -Likely d/t bilateral axillary LN dissection with breast cancer treatments.  -Using lymphedema pump at home. She has been to PT in the past; she has undergone lymphedema massage treatments, wrappings, etc. All have been minimally effective.     Peripheral neuropathy & hot flashes:  -Peripheral neuropathy likely multifactorial, but started after completion of chemotherapy.  Her fingertips and toes are most affected.  She also has periodic hot flashes during the day, but they are most bothersome at night; they affect her ability to sleep well.  She tells me she has not tried gabapentin in the past.  -Discussed with her a trial of escalating dose/scheduling of gabapentin, which may be helpful for both her peripheral neuropathy and her hot flashes.  My plan was for her to try 300 mg capsules, however this dose does not appear to be covered on the patient's pharmacy formulary.  Therefore, I provided a prescription today for gabapentin 100 mg with escalating schedule as follows: Take 1 capsule (100 mg) on day 1.  Take 1 capsule twice daily on day 2, then  take 1 capsule 3 times daily on days 3 and thereafter.  She reports concerns about this medication interfering with her methotrexate for her rheumatoid arthritis.  [employer] application was checked for drug drug interactions; also spoke with her pharmacist, Milbert Coulter.  There are no apparent drug drug interactions for methotrexate and gabapentin.  She agreed to give gabapentin a try.  I will asked nursing to give her a call in 2-3 weeks to see if her symptoms have improved.  If they have, then I am happy to continue to prescribe this medication for her with refills in the future.     Dispo:  -Return to cancer center in 1 year; no oncologic reason for labs. She sees her PCP regularly who collects blood work periodically per patient report.   All questions were answered to patient's stated satisfaction. Encouraged patient to call with any new concerns or questions before her next visit to the cancer center and we can certain see her sooner, if needed.        Orders placed this encounter:  No orders of the defined types were placed in this encounter.     Mike Craze, NP [place] [phone]

## 2018-02-20 ENCOUNTER — Telehealth (HOSPITAL_COMMUNITY): Payer: Self-pay

## 2018-02-20 NOTE — Telephone Encounter (Signed)
Called patient to check and see how she was doing on the gabapentin for her hot flashes and neuropathy. Patient states she got the prescription filled but never started taking it. She states she has not had as many hot flashes as previously stated and her neuropathy is no worse. Therefore, she did not start the medication. I also asked if she had contacted her PCP concerning a bone scan. She states she has not talked with him yet. She will discuss this with him at her future follow up. Instructed patient to call if she had any questions or needs. She verbalized understanding.

## 2018-02-20 NOTE — Telephone Encounter (Signed)
Noted.   She needs bone density (DEXA) through her PCP, not bone scan.  Just wanted to clarify that.    Thanks for following up with her! Elzie Rings

## 2018-09-17 DIAGNOSIS — I10 Essential (primary) hypertension: Secondary | ICD-10-CM | POA: Diagnosis not present

## 2018-09-17 DIAGNOSIS — M069 Rheumatoid arthritis, unspecified: Secondary | ICD-10-CM | POA: Diagnosis not present

## 2018-09-17 DIAGNOSIS — Z853 Personal history of malignant neoplasm of breast: Secondary | ICD-10-CM | POA: Diagnosis not present

## 2018-09-17 DIAGNOSIS — I972 Postmastectomy lymphedema syndrome: Secondary | ICD-10-CM | POA: Diagnosis not present

## 2019-01-01 ENCOUNTER — Other Ambulatory Visit (HOSPITAL_COMMUNITY): Payer: Self-pay | Admitting: Family Medicine

## 2019-01-01 ENCOUNTER — Emergency Department (HOSPITAL_COMMUNITY)
Admission: EM | Admit: 2019-01-01 | Discharge: 2019-01-01 | Disposition: A | Discharge status: Home / Self Care | Payer: Medicare Other | Attending: Emergency Medicine | Admitting: Emergency Medicine

## 2019-01-01 ENCOUNTER — Encounter (HOSPITAL_COMMUNITY): Payer: Self-pay | Admitting: Emergency Medicine

## 2019-01-01 ENCOUNTER — Ambulatory Visit (HOSPITAL_COMMUNITY)
Admission: RE | Admit: 2019-01-01 | Discharge: 2019-01-01 | Disposition: A | Discharge status: Home / Self Care | Payer: Medicare Other | Source: Ambulatory Visit | Attending: Family Medicine | Admitting: Family Medicine

## 2019-01-01 ENCOUNTER — Other Ambulatory Visit: Payer: Self-pay

## 2019-01-01 DIAGNOSIS — J209 Acute bronchitis, unspecified: Secondary | ICD-10-CM | POA: Diagnosis not present

## 2019-01-01 DIAGNOSIS — Z79899 Other long term (current) drug therapy: Secondary | ICD-10-CM | POA: Insufficient documentation

## 2019-01-01 DIAGNOSIS — Z853 Personal history of malignant neoplasm of breast: Secondary | ICD-10-CM | POA: Insufficient documentation

## 2019-01-01 DIAGNOSIS — J189 Pneumonia, unspecified organism: Secondary | ICD-10-CM

## 2019-01-01 DIAGNOSIS — J45909 Unspecified asthma, uncomplicated: Secondary | ICD-10-CM | POA: Diagnosis not present

## 2019-01-01 DIAGNOSIS — J45901 Unspecified asthma with (acute) exacerbation: Secondary | ICD-10-CM

## 2019-01-01 DIAGNOSIS — J4541 Moderate persistent asthma with (acute) exacerbation: Secondary | ICD-10-CM | POA: Diagnosis not present

## 2019-01-01 DIAGNOSIS — J449 Chronic obstructive pulmonary disease, unspecified: Secondary | ICD-10-CM | POA: Insufficient documentation

## 2019-01-01 DIAGNOSIS — R05 Cough: Secondary | ICD-10-CM | POA: Diagnosis present

## 2019-01-01 LAB — COMPREHENSIVE METABOLIC PANEL
ALK PHOS: 67 U/L (ref 38–126)
ALT: 30 U/L (ref 0–44)
AST: 29 U/L (ref 15–41)
Albumin: 3.9 g/dL (ref 3.5–5.0)
Anion gap: 6 (ref 5–15)
BUN: 15 mg/dL (ref 6–20)
CALCIUM: 9.9 mg/dL (ref 8.9–10.3)
CO2: 24 mmol/L (ref 22–32)
Chloride: 108 mmol/L (ref 98–111)
Creatinine, Ser: 0.63 mg/dL (ref 0.44–1.00)
GFR calc Af Amer: >60 mL/min (ref >60)
GFR calc non Af Amer: >60 mL/min (ref >60)
Glucose, Bld: 98 mg/dL (ref 70–99)
Potassium: 4 mmol/L (ref 3.5–5.1)
SODIUM: 138 mmol/L (ref 135–145)
Total Bilirubin: 0.7 mg/dL (ref 0.3–1.2)
Total Protein: 7.7 g/dL (ref 6.5–8.1)

## 2019-01-01 LAB — CBC
HCT: 39.5 % (ref 36.0–46.0)
Hemoglobin: 12.2 g/dL (ref 12.0–15.0)
MCH: 25 pg — ABNORMAL LOW (ref 26.0–34.0)
MCHC: 30.9 g/dL (ref 30.0–36.0)
MCV: 80.9 fL (ref 80.0–100.0)
PLATELETS: 236 10*3/uL (ref 150–400)
RBC: 4.88 MIL/uL (ref 3.87–5.11)
RDW: 17.2 % — ABNORMAL HIGH (ref 11.5–15.5)
WBC: 5.9 10*3/uL (ref 4.0–10.5)
nRBC: 0 % (ref 0.0–0.2)

## 2019-01-01 LAB — INFLUENZA PANEL BY PCR (TYPE A & B)
INFLBPCR: NEGATIVE
Influenza A By PCR: NEGATIVE

## 2019-01-01 MED ORDER — PREDNISONE 50 MG PO TABS
60.0000 mg | ORAL_TABLET | Freq: Once | ORAL | Status: AC
  Administered 2019-01-01: 60 mg via ORAL
  Filled 2019-01-01: qty 1

## 2019-01-01 MED ORDER — SODIUM CHLORIDE 0.9 % IV BOLUS
500.0000 mL | Freq: Once | INTRAVENOUS | Status: AC
  Administered 2019-01-01: 500 mL via INTRAVENOUS

## 2019-01-01 MED ORDER — PREDNISONE 10 MG PO TABS: 40.0000 mg | ORAL_TABLET | Freq: Every day | ORAL | 0 refills | Status: DC

## 2019-01-01 MED ORDER — IPRATROPIUM-ALBUTEROL 0.5-2.5 (3) MG/3ML IN SOLN
3.0000 mL | Freq: Once | RESPIRATORY_TRACT | Status: AC
  Administered 2019-01-01: 3 mL via RESPIRATORY_TRACT
  Filled 2019-01-01: qty 3

## 2019-01-01 MED ORDER — SODIUM CHLORIDE 0.9 % IV SOLN
INTRAVENOUS | Status: DC
  Administered 2019-01-01: 22:00:00 via INTRAVENOUS

## 2019-01-01 NOTE — ED Triage Notes (Signed)
Sent by Dr Karie Kirks for cough, congestion and sob.  Pt had neg chest xray at office.  Note from office states pt's sats were in 70's.  Pt sats are 93 on ra with no acute distress.

## 2019-01-01 NOTE — Discharge Instructions (Signed)
Use your albuterol nebulizer treatments or your pro-air on a regular basis every 6 hours for the next 7 days.  Take the prednisone as directed for the next 5 days.  Here tonight your oxygen saturations have been very stable.  Return for any new or worse symptoms.  Chest x-ray was negative for pneumonia.  That you had done earlier today.

## 2019-01-01 NOTE — ED Provider Notes (Signed)
[facility] EMERGENCY DEPARTMENT Provider Note   CSN: [id number] Arrival date & time: 01/01/19  1625     History   Chief Complaint Chief Complaint  Patient presents with  . Cough    HPI TRINITIE MCGIRR is a 58 y.o. female.  Patient sent in from Dr. Vickey Sages office for hypoxia.  Patient went there for cough and congestion and shortness of breath.  Her sats apparently were in the 70 percentile range there.  Patient upon arrival here her room air sats were 93%.  She for states that her breathing feels better.  She has a known history of asthma not COPD.  She has albuterol nebulizers at home as well as Proventil inhaler.  Patient currently not on steroids.  Chest x-ray done earlier today was negative for any acute findings.  No evidence of pneumonia.  Patient denies any fevers.  No real body aches just kind of the cough and congestion.  Symptoms have been more severe for about the past 3 days.     Past Medical History:  Diagnosis Date  . Adenocarcinoma of left breast ([facility])    double mastectomy 1990 then 2002   . Anemia   . Arthritis    rheumatoid  . Asthma   . BRCA1 positive, suspected 06/10/2014   Mother is BRCA1 +, sister died at 54 from breast cancer, another sister had breast cancer too.  . Breast cancer ([facility])    double mastectomy 1990 then 2002  . Chronic acquired lymphedema   . COPD (chronic obstructive pulmonary disease) ([facility])   . High cholesterol   . Infiltrating ductal carcinoma of right breast ([facility]) 06/10/2014  . S/P bilateral salpingo-oophorectomy 06/10/2014   August 2010    Patient Active Problem List   Diagnosis Date Noted  . Infiltrating ductal carcinoma of right breast ([facility]) 06/10/2014  . BRCA1 positive, suspected 06/10/2014  . S/P bilateral salpingo-oophorectomy 06/10/2014  . Cellulitis 03/26/2014  . Nausea and vomiting 03/26/2014  . Cellulitis of left arm 03/26/2014  . Chronic acquired lymphedema 03/26/2014  . COPD (chronic obstructive pulmonary disease)  ([facility])   . Arthritis   . Anemia   . High cholesterol   . Adenocarcinoma of left breast [facility])     Past Surgical History:  Procedure Laterality Date  . ABDOMINAL HYSTERECTOMY     complete hysterectomy  . BREAST SURGERY    . double mastectomy    . MASTECTOMY       OB History   No obstetric history on file.      Home Medications    Prior to Admission medications   Medication Sig Start Date End Date Taking? Authorizing Provider  albuterol (ACCUNEB) 0.63 MG/3ML nebulizer solution Take 1 ampule by nebulization 2 (two) times daily.   Yes [provider]  albuterol (PROVENTIL HFA;VENTOLIN HFA) 108 (90 BASE) MCG/ACT inhaler Inhale 2 puffs into the lungs every 4 (four) hours as needed.    Yes [provider]  cefTRIAXone Sodium (ROCEPHIN IJ) Inject 2.3 mLs into the muscle once. Administer my Karie Kirks, MD office   Yes [provider]  Ferrous Gluconate (IRON 27 PO) Take 27 mg by mouth every other day.    Yes [provider]  folic acid (FOLVITE) 1 MG tablet Take 1 mg by mouth daily.   Yes [provider]  furosemide (LASIX) 40 MG tablet Take 40 mg by mouth daily.  09/21/16  Yes [provider]  hydrochlorothiazide (HYDRODIURIL) 25 MG tablet Take 25 mg by mouth  daily.   Yes [provider]  methotrexate (RHEUMATREX) 2.5 MG tablet Take 15 mg by mouth every Tuesday. Caution:Chemotherapy. Protect from light. (takes 4 tablets weekly)   Yes [provider]  pravastatin (PRAVACHOL) 40 MG tablet Take 40 mg by mouth daily.   Yes [provider]  predniSONE (DELTASONE) 10 MG tablet Take 10 mg by mouth daily. For RA 08/15/16  Yes [provider]  predniSONE (DELTASONE) 10 MG tablet Take 4 tablets (40 mg total) by mouth daily. 01/01/19   Fredia Sorrow, MD    Family History Family History  Problem Relation Age of Onset  . Breast cancer Mother   . Breast cancer Sister   . Breast cancer Sister     Social  History Social History   Tobacco Use  . Smoking status: Never Smoker  . Smokeless tobacco: Never Used  Substance Use Topics  . Alcohol use: No  . Drug use: No     Allergies   Patient has no known allergies.   Review of Systems Review of Systems  Constitutional: Negative for chills and fever.  HENT: Positive for congestion. Negative for rhinorrhea and sore throat.   Eyes: Negative for visual disturbance.  Respiratory: Positive for cough, shortness of breath and wheezing.   Cardiovascular: Negative for chest pain and leg swelling.  Gastrointestinal: Negative for abdominal pain, diarrhea, nausea and vomiting.  Genitourinary: Negative for dysuria.  Musculoskeletal: Negative for back pain, myalgias and neck pain.  Skin: Negative for rash.  Neurological: Negative for dizziness, light-headedness and headaches.  Hematological: Does not bruise/bleed easily.  Psychiatric/Behavioral: Negative for confusion.     Physical Exam Updated Vital Signs BP 131/83   Pulse 98   Temp 99.3 F (37.4 C) (Oral)   Resp (!) 24   Ht 1.549 m (_0 )   Wt 81.6 kg   SpO2 95%   BMI 34.01 kg/m   Physical Exam Vitals signs and nursing note reviewed.  Constitutional:      General: She is not in acute distress.    Appearance: She is well-developed.  HENT:     Head: Normocephalic and atraumatic.     Mouth/Throat:     Mouth: Mucous membranes are moist.  Eyes:     Extraocular Movements: Extraocular movements intact.     Conjunctiva/sclera: Conjunctivae normal.     Pupils: Pupils are equal, round, and reactive to light.  Neck:     Musculoskeletal: Normal range of motion and neck supple.  Cardiovascular:     Rate and Rhythm: Normal rate and regular rhythm.     Heart sounds: Normal heart sounds. No murmur.  Pulmonary:     Effort: Pulmonary effort is normal. No respiratory distress.     Breath sounds: Normal breath sounds.  Abdominal:     General: Bowel sounds are normal.     Palpations:  Abdomen is soft.     Tenderness: There is no abdominal tenderness.  Musculoskeletal: Normal range of motion.        General: No swelling.  Skin:    General: Skin is warm and dry.     Capillary Refill: Capillary refill takes less than 2 seconds.  Neurological:     General: No focal deficit present.     Mental Status: She is alert and oriented to person, place, and time.      ED Treatments / Results  Labs (all labs ordered are listed, but only abnormal results are displayed) Labs Reviewed  CBC - Abnormal; Notable for  the following components:      Result Value   MCH 25.0 (*)    RDW 17.2 (*)    All other components within normal limits  COMPREHENSIVE METABOLIC PANEL  INFLUENZA PANEL BY PCR (TYPE A & B)    EKG None  Radiology Dg Chest 2 View  Result Date: 01/01/2019 CLINICAL DATA:  PNEUMONIA X 3-4 DAYS. HX: NON SMOKER, BREAST CANCER WITH BILATERAL MASTECTOMY. EXAM: CHEST - 2 VIEW COMPARISON:  None. FINDINGS: Heart size is normal. The lungs are free of focal consolidations and pleural effusions. Bilateral mastectomy. Surgical clips are seen the axillary regions bilaterally. Visualized osseous structures have a normal appearance. IMPRESSION: No evidence for acute cardiopulmonary abnormality. Electronically Signed   By: Nolon Nations M.D.   On: 01/01/2019 15:09    Procedures Procedures (including critical care time)  Medications Ordered in ED Medications  0.9 %  sodium chloride infusion ( Intravenous New Bag/Given 01/01/19 2149)  ipratropium-albuterol (DUONEB) 0.5-2.5 (3) MG/3ML nebulizer solution 3 mL (3 mLs Nebulization Given 01/01/19 2153)  predniSONE (DELTASONE) tablet 60 mg (60 mg Oral Given 01/01/19 2149)  sodium chloride 0.9 % bolus 500 mL (500 mLs Intravenous New Bag/Given 01/01/19 2149)     Initial Impression / Assessment and Plan / ED Course  I have reviewed the triage vital signs and the nursing notes.  Pertinent labs & imaging results that were available during my  care of the patient were reviewed by me and considered in my medical decision making (see chart for details).     Patient here with normal room air saturations.  Very coarse breath sounds.  Patient given breathing treatment here with even more improvement.  Sats went up into the upper 90s.  Patient given 1 dose of prednisone 60 mg.  Chest x-ray is reported that was done earlier today was reported as negative.  Patient probably with bronchitis and exacerbation of her known asthma.  Flu test was negative.  Patient nontoxic no acute distress.  Patient be continued on prednisone for 5 days.  She will continue to use her albuterol nebulizer and inhaler every 6 hours for the next 7 days.  She will return for any new or worse symptoms.  Patient overall feeling better.  Symptoms consistent with bronchitis probably with upper respiratory infection with a exacerbation of asthma. Final Clinical Impressions(s) / ED Diagnoses   Final diagnoses:  Moderate asthma with exacerbation, unspecified whether persistent  Acute bronchitis, unspecified organism    ED Discharge Orders         Ordered    predniSONE (DELTASONE) 10 MG tablet  Daily     01/01/19 2304           Fredia Sorrow, MD 01/01/19 2330

## 2019-01-18 ENCOUNTER — Ambulatory Visit (HOSPITAL_COMMUNITY): Payer: Medicare Other | Admitting: Internal Medicine

## 2020-03-31 IMAGING — DX DG CHEST 2V
2 series · 2 of 2 positions shown · non-contrast
Comparison: None.

CLINICAL DATA: PNEUMONIA X 3-4 DAYS. HX: NON SMOKER, BREAST CANCER
WITH BILATERAL MASTECTOMY.

EXAM:
CHEST - 2 VIEW

[chest pa]
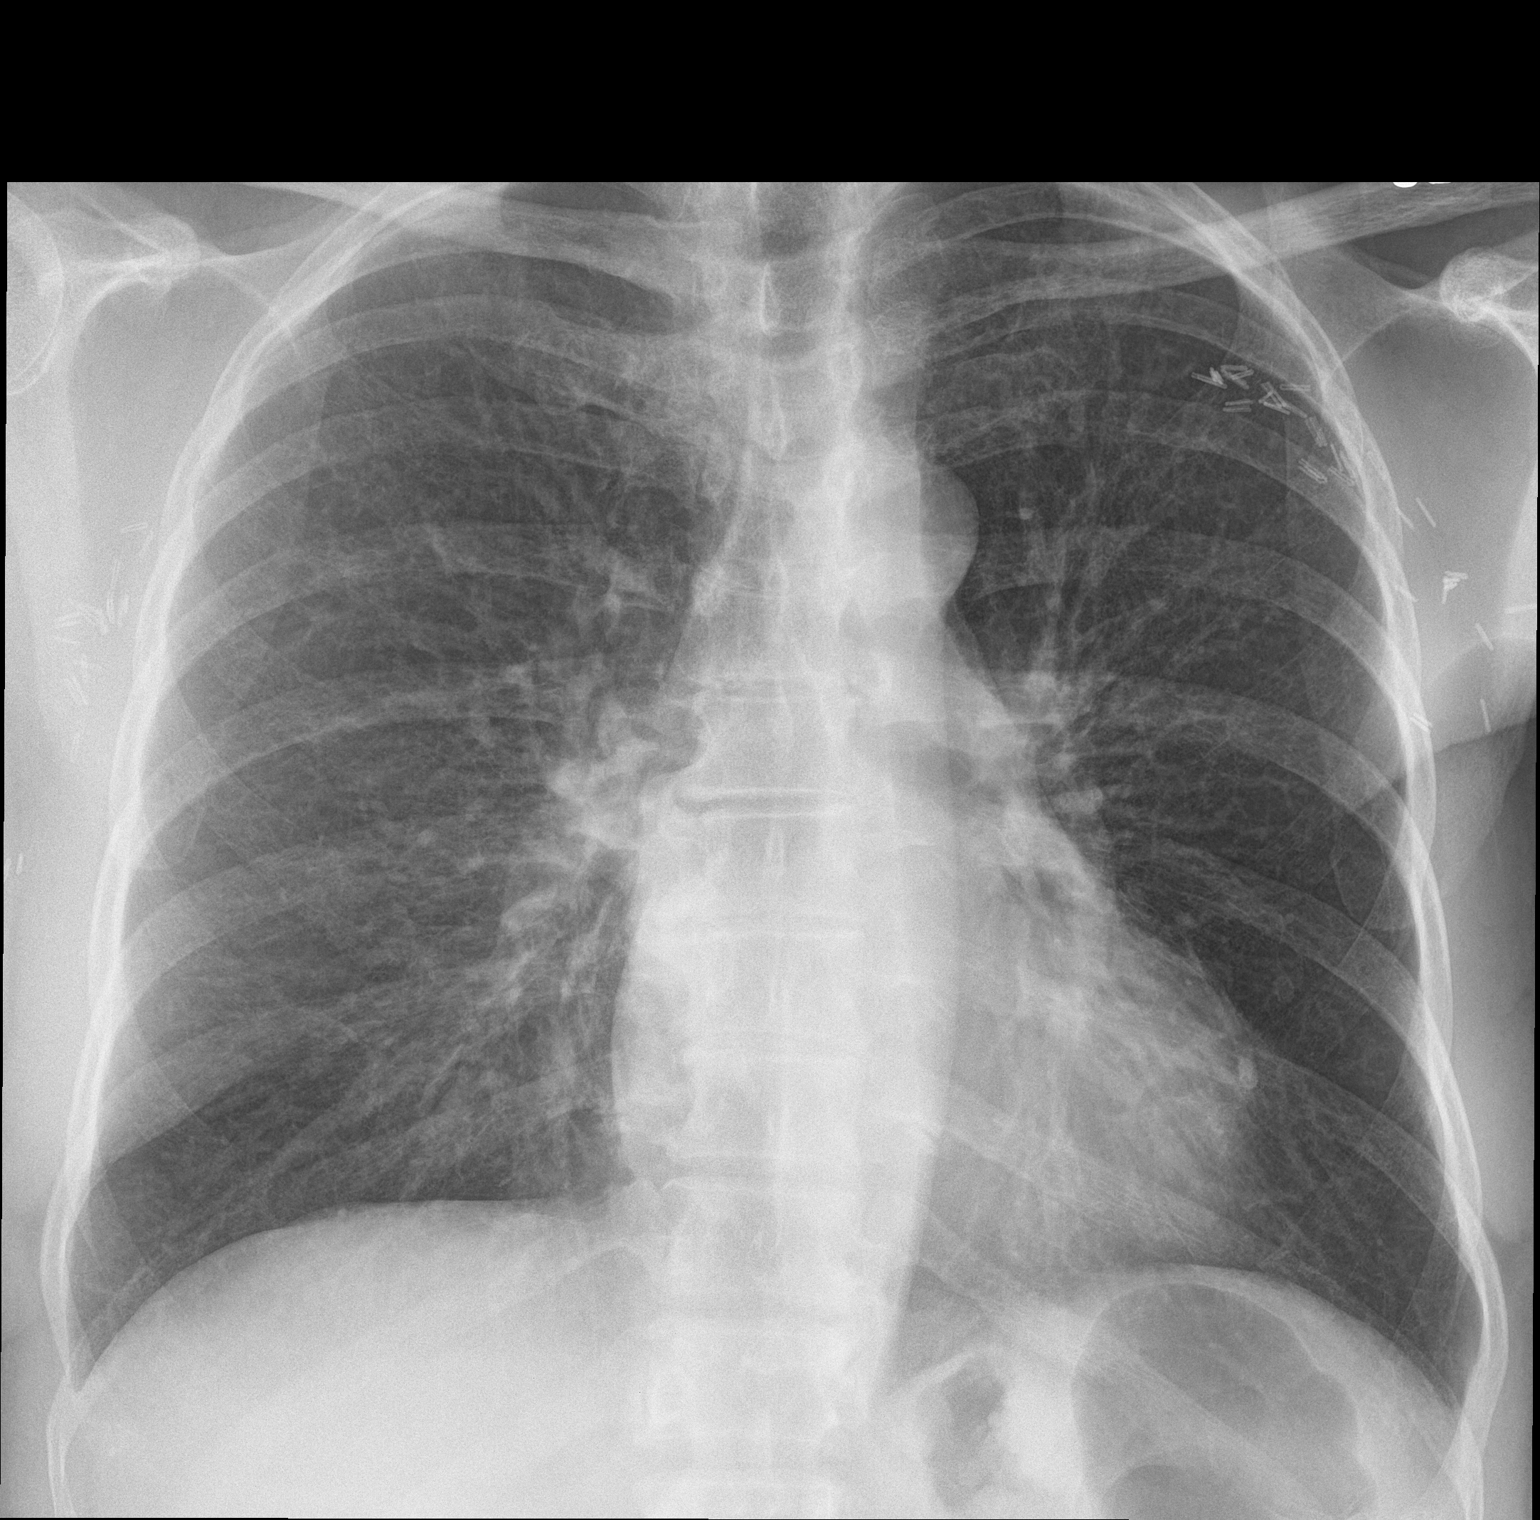

[chest lat]
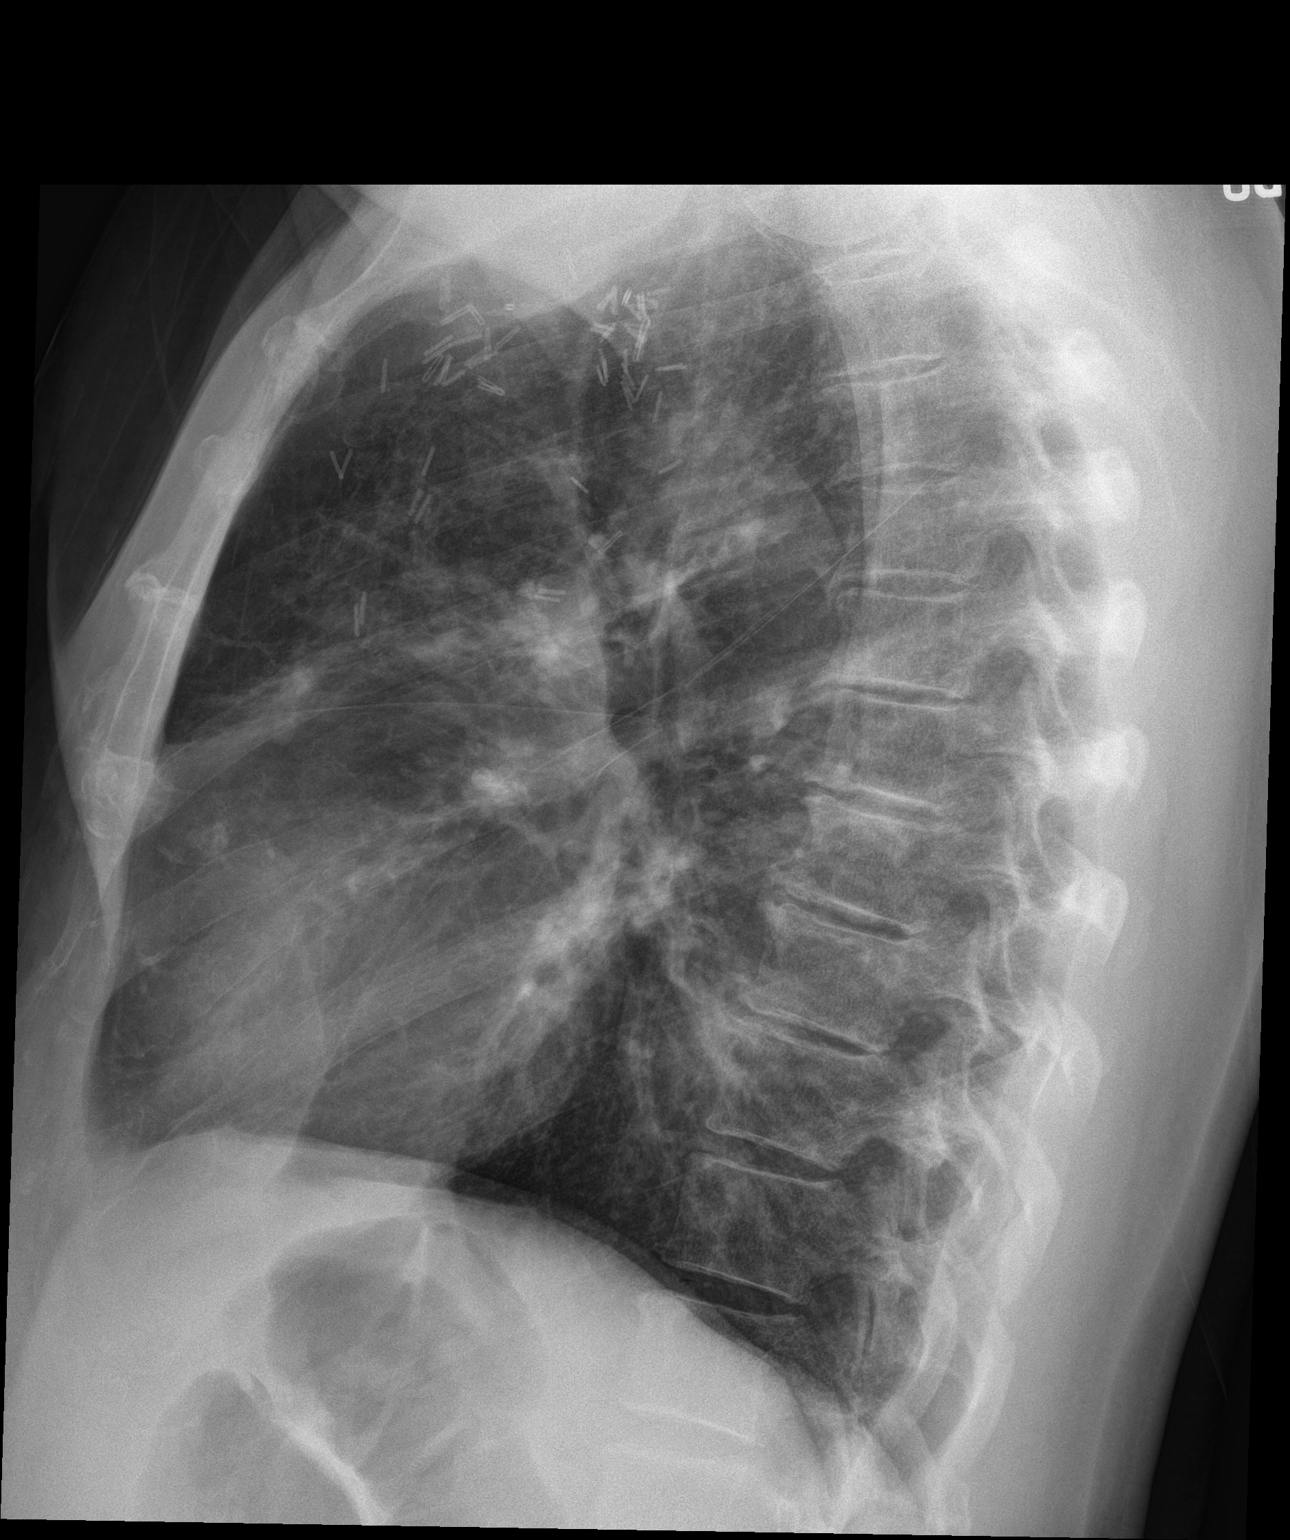

[2 of 2 positions shown; findings below may reference images not displayed]

FINDINGS: Heart size is normal. The lungs are free of focal consolidations and
pleural effusions. Bilateral mastectomy. Surgical clips are seen the
axillary regions bilaterally. Visualized osseous structures have a
normal appearance.
IMPRESSION: No evidence for acute cardiopulmonary abnormality.

## 2020-07-21 ENCOUNTER — Other Ambulatory Visit (HOSPITAL_COMMUNITY): Payer: Self-pay | Admitting: *Deleted

## 2020-07-21 DIAGNOSIS — C50912 Malignant neoplasm of unspecified site of left female breast: Secondary | ICD-10-CM

## 2020-07-22 ENCOUNTER — Inpatient Hospital Stay (HOSPITAL_COMMUNITY): Payer: Medicare Other | Attending: Hematology

## 2020-07-22 ENCOUNTER — Other Ambulatory Visit: Payer: Self-pay

## 2020-07-22 DIAGNOSIS — N951 Menopausal and female climacteric states: Secondary | ICD-10-CM | POA: Diagnosis not present

## 2020-07-22 DIAGNOSIS — Z853 Personal history of malignant neoplasm of breast: Secondary | ICD-10-CM | POA: Diagnosis present

## 2020-07-22 DIAGNOSIS — C50912 Malignant neoplasm of unspecified site of left female breast: Secondary | ICD-10-CM

## 2020-07-22 LAB — COMPREHENSIVE METABOLIC PANEL
ALT: 25 U/L (ref 0–44)
AST: 19 U/L (ref 15–41)
Albumin: 3.5 g/dL (ref 3.5–5.0)
Alkaline Phosphatase: 61 U/L (ref 38–126)
Anion gap: 7 (ref 5–15)
BUN: 14 mg/dL (ref 8–23)
CO2: 27 mmol/L (ref 22–32)
Calcium: 9.7 mg/dL (ref 8.9–10.3)
Chloride: 104 mmol/L (ref 98–111)
Creatinine, Ser: 0.6 mg/dL (ref 0.44–1.00)
GFR calc Af Amer: >60 mL/min (ref >60)
GFR calc non Af Amer: >60 mL/min (ref >60)
Glucose, Bld: 97 mg/dL (ref 70–99)
Potassium: 3.6 mmol/L (ref 3.5–5.1)
Sodium: 138 mmol/L (ref 135–145)
Total Bilirubin: 0.6 mg/dL (ref 0.3–1.2)
Total Protein: 6.6 g/dL (ref 6.5–8.1)

## 2020-07-22 LAB — CBC WITH DIFFERENTIAL/PLATELET
Abs Immature Granulocytes: 0.03 10*3/uL (ref 0.00–0.07)
Basophils Absolute: 0 10*3/uL (ref 0.0–0.1)
Basophils Relative: 1 %
Eosinophils Absolute: 0.1 10*3/uL (ref 0.0–0.5)
Eosinophils Relative: 2 %
HCT: 37.4 % (ref 36.0–46.0)
Hemoglobin: 11.7 g/dL — ABNORMAL LOW (ref 12.0–15.0)
Immature Granulocytes: 1 %
Lymphocytes Relative: 31 %
Lymphs Abs: 1.3 10*3/uL (ref 0.7–4.0)
MCH: 25.5 pg — ABNORMAL LOW (ref 26.0–34.0)
MCHC: 31.3 g/dL (ref 30.0–36.0)
MCV: 81.7 fL (ref 80.0–100.0)
Monocytes Absolute: 0.6 10*3/uL (ref 0.1–1.0)
Monocytes Relative: 13 %
Neutro Abs: 2.3 10*3/uL (ref 1.7–7.7)
Neutrophils Relative %: 52 %
Platelets: 224 10*3/uL (ref 150–400)
RBC: 4.58 MIL/uL (ref 3.87–5.11)
RDW: 17 % — ABNORMAL HIGH (ref 11.5–15.5)
WBC: 4.3 10*3/uL (ref 4.0–10.5)
nRBC: 0 % (ref 0.0–0.2)

## 2020-07-22 LAB — VITAMIN D 25 HYDROXY (VIT D DEFICIENCY, FRACTURES): Vit D, 25-Hydroxy: 20.04 ng/mL — ABNORMAL LOW (ref 30–100)

## 2020-07-29 ENCOUNTER — Inpatient Hospital Stay (HOSPITAL_COMMUNITY): Payer: Medicare Other | Attending: Nurse Practitioner | Admitting: Nurse Practitioner

## 2020-07-29 ENCOUNTER — Other Ambulatory Visit: Payer: Self-pay

## 2020-07-29 DIAGNOSIS — Z9221 Personal history of antineoplastic chemotherapy: Secondary | ICD-10-CM | POA: Insufficient documentation

## 2020-07-29 DIAGNOSIS — Z9013 Acquired absence of bilateral breasts and nipples: Secondary | ICD-10-CM | POA: Insufficient documentation

## 2020-07-29 DIAGNOSIS — C50912 Malignant neoplasm of unspecified site of left female breast: Secondary | ICD-10-CM

## 2020-07-29 DIAGNOSIS — Z853 Personal history of malignant neoplasm of breast: Secondary | ICD-10-CM | POA: Insufficient documentation

## 2020-07-29 NOTE — Assessment & Plan Note (Signed)
1. Bilateral breast cancers: °-Stage III adenocarcinoma of the left breast, ER negative, PR negative, HER-2 negative. °-Diagnosed in 2000 and treated with left mastectomy and ALND. Treated with adjuvant chemotherapy with Adriamycin/Cytoxan followed by Taxol. °-Also had stage IIa right breast cancer ER positive. °-Diagnosed in 1993 and treated with right mastectomy and ALND. Treated with adjuvant chemotherapy with CMF x6 cycles. Received antiestrogen therapy with tamoxifen after chemotherapy. °-No role for mammograms yearly due to bilateral mastectomies. °-Labs done on 07/22/2020 showed hemoglobin 11.7 all other labs were WNL. °-She wishes to follow-up with her PCP for labs. °

## 2020-07-29 NOTE — Progress Notes (Signed)
[place] [place], [place] [address]   CLINIC:  Medical Oncology/Hematology  PCP:  Lemmie Evens, MD [place] [address] [phone]   REASON FOR VISIT: Follow-up for bilateral breast cancers   CURRENT THERAPY: Surveillance  BRIEF ONCOLOGIC HISTORY:  Oncology History  Infiltrating ductal carcinoma of right breast [facility])    CANCER STAGING: Cancer Staging Adenocarcinoma of left breast ([place]) Staging form: Breast, AJCC 7th Edition - Clinical: Stage IIIA (T3, N2, cM0) - Signed by Baird Cancer, PA-C on 06/10/2014  Infiltrating ductal carcinoma of right breast ([name]) Staging form: Breast, AJCC 7th Edition - Clinical: Stage IIA (T2, N0, cM0) - Signed by Baird Cancer, PA-C on 06/10/2014    INTERVAL HISTORY:  [name] 59 y.o. female returns for routine follow-up for bilateral breast cancer. Patient reports she is doing well since her last visit. She has not been to our clinic in over 2-1/2 years. She has no complaints at this time. She denies any new lumps or bumps present. Denies any nausea, vomiting, or diarrhea. Denies any new pains. Had not noticed any recent bleeding such as epistaxis, hematuria or hematochezia. Denies recent chest pain on exertion, shortness of breath on minimal exertion, pre-syncopal episodes, or palpitations. Denies any numbness or tingling in hands or feet. Denies any recent fevers, infections, or recent hospitalizations. Patient reports appetite at 100% and energy level at 100%.     REVIEW OF SYSTEMS:  Review of Systems  Neurological: Positive for numbness.  All other systems reviewed and are negative.    PAST MEDICAL/SURGICAL HISTORY:  Past Medical History:  Diagnosis Date   Adenocarcinoma of left breast ([place])    double mastectomy 1990 then 2002    Anemia    Arthritis    rheumatoid   Asthma    BRCA1 positive, suspected 06/10/2014   Mother is BRCA1 +, sister died at 61 from  breast cancer, another sister had breast cancer too.   Breast cancer ([place])    double mastectomy 1990 then 2002   Chronic acquired lymphedema    COPD (chronic obstructive pulmonary disease) (HCC)    High cholesterol    Infiltrating ductal carcinoma of right breast ([place]) 06/10/2014   S/P bilateral salpingo-oophorectomy 06/10/2014   August 2010   Past Surgical History:  Procedure Laterality Date   ABDOMINAL HYSTERECTOMY     complete hysterectomy   BREAST SURGERY     double mastectomy     MASTECTOMY       SOCIAL HISTORY:  Social History   Socioeconomic History   Marital status: Married    Spouse name: Not on file   Number of children: Not on file   Years of education: Not on file   Highest education level: Not on file  Occupational History   Not on file  Tobacco Use   Smoking status: Never Smoker   Smokeless tobacco: Never Used  Vaping Use   Vaping Use: Never used  Substance and Sexual Activity   Alcohol use: No   Drug use: No   Sexual activity: Yes    Birth control/protection: Surgical  Other Topics Concern   Not on file  Social History Narrative   Not on file   Social Determinants of Health   Financial Resource Strain:    Difficulty of Paying Living Expenses: Not on file  Food Insecurity:    Worried About Running Out of Food in the Last Year: Not on file   [employer]  of Food in the Last Year: Not on file  Transportation Needs:    Lack of Transportation (Medical): Not on file   Lack of Transportation (Non-Medical): Not on file  Physical Activity:    Days of Exercise per Week: Not on file   Minutes of Exercise per Session: Not on file  Stress:    Feeling of Stress : Not on file  Social Connections:    Frequency of Communication with Friends and Family: Not on file   Frequency of Social Gatherings with Friends and Family: Not on file   Attends Religious Services: Not on file   Active Member of Clubs or Organizations: Not on  file   Attends Archivist Meetings: Not on file   Marital Status: Not on file  Intimate Partner Violence:    Fear of Current or Ex-Partner: Not on file   Emotionally Abused: Not on file   Physically Abused: Not on file   Sexually Abused: Not on file    FAMILY HISTORY:  Family History  Problem Relation Age of Onset   Breast cancer Mother    Breast cancer Sister    Breast cancer Sister     CURRENT MEDICATIONS:  Outpatient Encounter Medications as of 07/29/2020  Medication Sig   albuterol (ACCUNEB) 0.63 MG/3ML nebulizer solution Take 1 ampule by nebulization 2 (two) times daily.   albuterol (PROVENTIL HFA;VENTOLIN HFA) 108 (90 BASE) MCG/ACT inhaler Inhale 2 puffs into the lungs every 4 (four) hours as needed.    Ferrous Gluconate (IRON 27 PO) Take 27 mg by mouth every other day.    fluticasone (FLONASE) 50 MCG/ACT nasal spray Place into both nostrils.   folic acid (FOLVITE) 1 MG tablet Take 1 mg by mouth daily.   furosemide (LASIX) 40 MG tablet Take 40 mg by mouth daily.    hydrochlorothiazide (HYDRODIURIL) 25 MG tablet Take 25 mg by mouth daily.   methotrexate (RHEUMATREX) 2.5 MG tablet Take 15 mg by mouth every Tuesday. Caution:Chemotherapy. Protect from light. (takes 4 tablets weekly)   pravastatin (PRAVACHOL) 40 MG tablet Take 40 mg by mouth daily.   predniSONE (DELTASONE) 10 MG tablet Take 10 mg by mouth daily. For RA   predniSONE (DELTASONE) 10 MG tablet Take 4 tablets (40 mg total) by mouth daily.   [DISCONTINUED] cefTRIAXone Sodium (ROCEPHIN IJ) Inject 2.3 mLs into the muscle once. Administer my Karie Kirks, MD office   No facility-administered encounter medications on file as of 07/29/2020.    ALLERGIES:  No Known Allergies   PHYSICAL EXAM:  ECOG Performance status: 1  Vitals:   07/29/20 0905  BP: 133/68  Pulse: 69  Resp: 18  Temp: (!) 96.9 F (36.1 C)  SpO2: 100%   Filed Weights   07/29/20 0905  Weight: 186 lb 1.6 oz (84.4 kg)    Physical Exam Constitutional:      Appearance: Normal appearance. She is normal weight.  Cardiovascular:     Rate and Rhythm: Normal rate and regular rhythm.     Heart sounds: Normal heart sounds.  Pulmonary:     Effort: Pulmonary effort is normal.     Breath sounds: Normal breath sounds.  Abdominal:     General: Bowel sounds are normal.     Palpations: Abdomen is soft.  Musculoskeletal:        General: Normal range of motion.  Skin:    General: Skin is warm.  Neurological:     Mental Status: She is alert and oriented to person, place, and  time. Mental status is at baseline.  Psychiatric:        Mood and Affect: Mood normal.        Behavior: Behavior normal.        Thought Content: Thought content normal.        Judgment: Judgment normal.      LABORATORY DATA:  I have reviewed the labs as listed.  CBC    Component Value Date/Time   WBC 4.3 07/22/2020 0910   RBC 4.58 07/22/2020 0910   HGB 11.7 (L) 07/22/2020 0910   HCT 37.4 07/22/2020 0910   PLT 224 07/22/2020 0910   MCV 81.7 07/22/2020 0910   MCH 25.5 (L) 07/22/2020 0910   MCHC 31.3 07/22/2020 0910   RDW 17.0 (H) 07/22/2020 0910   LYMPHSABS 1.3 07/22/2020 0910   MONOABS 0.6 07/22/2020 0910   EOSABS 0.1 07/22/2020 0910   BASOSABS 0.0 07/22/2020 0910   CMP Latest Ref Rng & Units 07/22/2020 01/01/2019 03/29/2014  Glucose 70 - 99 mg/dL 97 98 104(H)  BUN 8 - 23 mg/dL $Remove'14 15 6  'YbFuSPV$ Creatinine 0.44 - 1.00 mg/dL 0.60 0.63 0.69  Sodium 135 - 145 mmol/L 138 138 139  Potassium 3.5 - 5.1 mmol/L 3.6 4.0 3.6(L)  Chloride 98 - 111 mmol/L 104 108 101  CO2 22 - 32 mmol/L $RemoveB'27 24 27  'pHIfChmL$ Calcium 8.9 - 10.3 mg/dL 9.7 9.9 9.2  Total Protein 6.5 - 8.1 g/dL 6.6 7.7 6.6  Total Bilirubin 0.3 - 1.2 mg/dL 0.6 0.7 0.4  Alkaline Phos 38 - 126 U/L 61 67 42  AST 15 - 41 U/L 19 29 165(H)  ALT 0 - 44 U/L 25 30 145(H)    All questions were answered to patient's stated satisfaction. Encouraged patient to call with any new concerns or questions before  his next visit to the cancer center and we can certain see him sooner, if needed.     ASSESSMENT & PLAN:  Adenocarcinoma of left breast 1. Bilateral breast cancers: -Stage III adenocarcinoma of the left breast, ER negative, PR negative, HER-2 negative. -Diagnosed in 2000 and treated with left mastectomy and ALND. Treated with adjuvant chemotherapy with Adriamycin/Cytoxan followed by Taxol. -Also had stage IIa right breast cancer ER positive. -Diagnosed in 1993 and treated with right mastectomy and ALND. Treated with adjuvant chemotherapy with CMF x6 cycles. Received antiestrogen therapy with tamoxifen after chemotherapy. -No role for mammograms yearly due to bilateral mastectomies. -Labs done on 07/22/2020 showed hemoglobin 11.7 all other labs were WNL. -She wishes to follow-up with her PCP for labs.     Orders placed this encounter:  No orders of the defined types were placed in this encounter.     Francene Finders, FNP-C [place] [phone]

## 2020-12-03 ENCOUNTER — Other Ambulatory Visit: Payer: Medicare Other

## 2020-12-03 DIAGNOSIS — Z20822 Contact with and (suspected) exposure to covid-19: Secondary | ICD-10-CM

## 2020-12-05 LAB — SARS-COV-2, NAA 2 DAY TAT

## 2020-12-05 LAB — NOVEL CORONAVIRUS, NAA: SARS-CoV-2, NAA: NOT DETECTED

## 2021-04-27 ENCOUNTER — Other Ambulatory Visit (HOSPITAL_COMMUNITY): Payer: Self-pay | Admitting: Family Medicine

## 2021-04-27 DIAGNOSIS — R0789 Other chest pain: Secondary | ICD-10-CM

## 2021-04-29 ENCOUNTER — Ambulatory Visit (HOSPITAL_COMMUNITY)
Admission: RE | Admit: 2021-04-29 | Discharge: 2021-04-29 | Disposition: A | Discharge status: Home / Self Care | Payer: Medicare Other | Source: Ambulatory Visit | Attending: Family Medicine | Admitting: Family Medicine

## 2021-04-29 DIAGNOSIS — R0789 Other chest pain: Secondary | ICD-10-CM | POA: Insufficient documentation

## 2021-11-02 ENCOUNTER — Ambulatory Visit
Admission: EM | Admit: 2021-11-02 | Discharge: 2021-11-02 | Disposition: A | Discharge status: Home / Self Care | Payer: Medicare Other | Attending: Physician Assistant | Admitting: Physician Assistant

## 2021-11-02 ENCOUNTER — Other Ambulatory Visit: Payer: Self-pay

## 2021-11-02 DIAGNOSIS — R051 Acute cough: Secondary | ICD-10-CM

## 2021-11-02 DIAGNOSIS — J069 Acute upper respiratory infection, unspecified: Secondary | ICD-10-CM

## 2021-11-02 NOTE — ED Triage Notes (Signed)
Patient presents to Urgent Care with complaints of loss of taste/smell x 6 days ago. The headache and cough x 10 days ago. Treating symptoms with nasal spray, cough medication, and honey.   Denies fever.

## 2021-11-03 LAB — COVID-19, FLU A+B NAA
Influenza A, NAA: NOT DETECTED
Influenza B, NAA: NOT DETECTED
SARS-CoV-2, NAA: NOT DETECTED

## 2021-11-07 NOTE — ED Provider Notes (Signed)
[facility]    CSN: [id number] Arrival date & time: 11/02/21  1031      History   Chief Complaint Chief Complaint  Patient presents with   Headache   Cough    HPI [name] is a 60 y.o. female.   The history is provided by the patient. No language interpreter was used.  Headache Associated symptoms: cough   Cough Cough characteristics:  Non-productive Sputum characteristics:  Nondescript Severity:  Moderate Onset quality:  Gradual Duration:  6 days Timing:  Constant Chronicity:  New Relieved by:  Nothing Ineffective treatments:  None tried Associated symptoms: headaches    Past Medical History:  Diagnosis Date   Adenocarcinoma of left breast ([place])    double mastectomy 1990 then 2002    Anemia    Arthritis    rheumatoid   Asthma    BRCA1 positive, suspected 06/10/2014   Mother is BRCA1 +, sister died at 82 from breast cancer, another sister had breast cancer too.   Breast cancer ([place])    double mastectomy 1990 then 2002   Chronic acquired lymphedema    COPD (chronic obstructive pulmonary disease) (H[facility])    High cholesterol    Infiltrating ductal carcinoma of right breast ([place]) 06/10/2014   S/P bilateral salpingo-oophorectomy 06/10/2014   August 2010    Patient Active Problem List   Diagnosis Date Noted   Infiltrating ductal carcinoma of right breast ([place]) 06/10/2014   BRCA1 positive, suspected 06/10/2014   S/P bilateral salpingo-oophorectomy 06/10/2014   Cellulitis 03/26/2014   Nausea and vomiting 03/26/2014   Cellulitis of left arm 03/26/2014   Chronic acquired lymphedema 03/26/2014   COPD (chronic obstructive pulmonary disease) ([place])    Arthritis    Anemia    High cholesterol    Adenocarcinoma of left breast ([place])     Past Surgical History:  Procedure Laterality Date   ABDOMINAL HYSTERECTOMY     complete hysterectomy   BREAST SURGERY     double mastectomy     MASTECTOMY      OB History   No obstetric history on file.       Home Medications    Prior to Admission medications   Medication Sig Start Date End Date Taking? Authorizing Provider  albuterol (ACCUNEB) 0.63 MG/3ML nebulizer solution Take 1 ampule by nebulization 2 (two) times daily.    [provider]  albuterol (PROVENTIL HFA;VENTOLIN HFA) 108 (90 BASE) MCG/ACT inhaler Inhale 2 puffs into the lungs every 4 (four) hours as needed.     [provider]  Ferrous Gluconate (IRON 27 PO) Take 27 mg by mouth every other day.     [provider]  fluticasone (FLONASE) 50 MCG/ACT nasal spray Place into both nostrils. 04/14/20   [provider]  folic acid (FOLVITE) 1 MG tablet Take 1 mg by mouth daily.    [provider]  furosemide (LASIX) 40 MG tablet Take 40 mg by mouth daily.  09/21/16   [provider]  hydrochlorothiazide (HYDRODIURIL) 25 MG tablet Take 25 mg by mouth daily.    [provider]  methotrexate (RHEUMATREX) 2.5 MG tablet Take 15 mg by mouth every Tuesday. Caution:Chemotherapy. Protect from light. (takes 4 tablets weekly)    [provider]  pravastatin (PRAVACHOL) 40 MG tablet Take 40 mg by mouth daily.    [provider]  predniSONE (DELTASONE) 10 MG tablet Take 10 mg by mouth daily. For RA 08/15/16   [provider]  predniSONE (  DELTASONE) 10 MG tablet Take 4 tablets (40 mg total) by mouth daily. 01/01/19   Fredia Sorrow, MD    Family History Family History  Problem Relation Age of Onset   Breast cancer Mother    Breast cancer Sister    Breast cancer Sister     Social History Social History   Tobacco Use   Smoking status: Never   Smokeless tobacco: Never  Vaping Use   Vaping Use: Never used  Substance Use Topics   Alcohol use: No   Drug use: No     Allergies   Patient has no known allergies.   Review of Systems Review of Systems  Respiratory:  Positive for cough.   Neurological:  Positive for headaches.  All other systems  reviewed and are negative.   Physical Exam Triage Vital Signs ED Triage Vitals  Enc Vitals Group     BP 11/02/21 1122 115/78     Pulse Rate 11/02/21 1122 79     Resp 11/02/21 1122 16     Temp 11/02/21 1122 98.2 F (36.8 C)     Temp Source 11/02/21 1122 Oral     SpO2 11/02/21 1122 97 %     Weight --      Height --      Head Circumference --      Peak Flow --      Pain Score 11/02/21 1121 0     Pain Loc --      Pain Edu? --      Excl. in [place]? --    No data found.  Updated Vital Signs BP 115/78 (BP Location: Right Arm)   Pulse 79   Temp 98.2 F (36.8 C) (Oral)   Resp 16   SpO2 97%   Visual Acuity Right Eye Distance:   Left Eye Distance:   Bilateral Distance:    Right Eye Near:   Left Eye Near:    Bilateral Near:     Physical Exam Vitals reviewed.  Cardiovascular:     Rate and Rhythm: Normal rate.     Heart sounds: Normal heart sounds.  Pulmonary:     Effort: Pulmonary effort is normal.  Skin:    General: Skin is warm.  Neurological:     Mental Status: She is alert.  Psychiatric:        Mood and Affect: Mood normal.     UC Treatments / Results  Labs (all labs ordered are listed, but only abnormal results are displayed) Labs Reviewed  COVID-19, FLU A+B NAA   Narrative:    Performed at:  [address] [place], [place]  [id number] Lab Director: Rush Farmer MD, Phone:  [phone]    EKG   Radiology No results found.  Procedures Procedures (including critical care time)  Medications Ordered in UC Medications - No data to display  Initial Impression / Assessment and Plan / UC Course  I have reviewed the triage vital signs and the nursing notes.  Pertinent labs & imaging results that were available during my care of the patient were reviewed by me and considered in my medical decision making (see chart for details).      Final Clinical Impressions(s) / UC Diagnoses   Final diagnoses:  Acute cough   Discharge  Instructions   None    ED Prescriptions   None    PDMP not reviewed this encounter.    Fransico Meadow, [place] 11/07/21 1812

## 2022-01-05 ENCOUNTER — Other Ambulatory Visit: Payer: Self-pay | Admitting: Genetic Counselor

## 2022-01-05 ENCOUNTER — Inpatient Hospital Stay: Payer: Medicare Other | Attending: Genetic Counselor

## 2022-01-05 ENCOUNTER — Ambulatory Visit (HOSPITAL_BASED_OUTPATIENT_CLINIC_OR_DEPARTMENT_OTHER): Payer: Medicare Other | Admitting: Genetic Counselor

## 2022-01-05 DIAGNOSIS — C50912 Malignant neoplasm of unspecified site of left female breast: Secondary | ICD-10-CM

## 2022-01-05 DIAGNOSIS — Z853 Personal history of malignant neoplasm of breast: Secondary | ICD-10-CM

## 2022-01-05 DIAGNOSIS — Z8041 Family history of malignant neoplasm of ovary: Secondary | ICD-10-CM

## 2022-01-05 DIAGNOSIS — Z803 Family history of malignant neoplasm of breast: Secondary | ICD-10-CM | POA: Diagnosis not present

## 2022-01-05 DIAGNOSIS — Z800 Family history of malignant neoplasm of digestive organs: Secondary | ICD-10-CM | POA: Diagnosis not present

## 2022-01-05 LAB — GENETIC SCREENING ORDER

## 2022-01-06 ENCOUNTER — Encounter: Payer: Self-pay | Admitting: Genetic Counselor

## 2022-01-06 DIAGNOSIS — Z800 Family history of malignant neoplasm of digestive organs: Secondary | ICD-10-CM | POA: Insufficient documentation

## 2022-01-06 DIAGNOSIS — Z8041 Family history of malignant neoplasm of ovary: Secondary | ICD-10-CM | POA: Insufficient documentation

## 2022-01-06 DIAGNOSIS — Z853 Personal history of malignant neoplasm of breast: Secondary | ICD-10-CM | POA: Insufficient documentation

## 2022-01-06 DIAGNOSIS — Z803 Family history of malignant neoplasm of breast: Secondary | ICD-10-CM | POA: Insufficient documentation

## 2022-01-06 NOTE — Progress Notes (Signed)
REFERRING PROVIDER: Lemmie Evens, MD [place],  [place] [address]  PRIMARY PROVIDER:  Lemmie Evens, MD  PRIMARY REASON FOR VISIT:  1. Family history of breast cancer   2. Family history of ovarian cancer   3. Family history of pancreatic cancer   4. Personal history of malignant neoplasm of breast      HISTORY OF PRESENT ILLNESS:   [name], a 61 y.o. female, was seen for a [facility] cancer genetics consultation at the request of Dr. Karie Kirks due to a personal and family history of cancer.  [name] presents to clinic today to discuss the possibility of a hereditary predisposition to cancer, genetic testing, and to further clarify her future cancer risks, as well as potential cancer risks for family members.   In 1992, at the age of 82, [name] was diagnosed with breast cancer.  The treatment plan included a unilateral mastectomy. In the late 1990's, [name] was diagnosed with contralateral breast cancer which was treated with another mastectomy.    CANCER HISTORY:  Oncology History  Infiltrating ductal carcinoma of right breast ([place] [facility])     RISK FACTORS:  Menarche was at age 55.  First live birth at age 49.  OCP use for approximately 0 years.  Ovaries intact: no.  Hysterectomy: yes.  Menopausal status: postmenopausal.  HRT use: 0 years. Colonoscopy: yes; normal. Mammogram within the last year: n/a. Number of breast biopsies: 2. Up to date with pelvic exams: yes. Any excessive radiation exposure in the past: no  Past Medical History:  Diagnosis Date   Adenocarcinoma of left breast ([facility])    double mastectomy 1990 then 2002    Anemia    Arthritis    rheumatoid   Asthma    BRCA1 positive, suspected 06/10/2014   Mother is BRCA1 +, sister died at 11 from breast cancer, another sister had breast cancer too.   Breast cancer ([facility])    double mastectomy 1990 then 2002   Chronic acquired lymphedema    COPD (chronic obstructive pulmonary disease)  ([facility])    Family history of breast cancer    Family history of ovarian cancer    Family history of pancreatic cancer    High cholesterol    Infiltrating ductal carcinoma of right breast ([facility]) 06/10/2014   Personal history of malignant neoplasm of breast    S/P bilateral salpingo-oophorectomy 06/10/2014   August 2010    Past Surgical History:  Procedure Laterality Date   ABDOMINAL HYSTERECTOMY     complete hysterectomy   BREAST SURGERY     double mastectomy     MASTECTOMY      Social History   Socioeconomic History   Marital status: Married    Spouse name: Not on file   Number of children: Not on file   Years of education: Not on file   Highest education level: Not on file  Occupational History   Not on file  Tobacco Use   Smoking status: Never   Smokeless tobacco: Never  Vaping Use   Vaping Use: Never used  Substance and Sexual Activity   Alcohol use: No   Drug use: No   Sexual activity: Yes    Birth control/protection: Surgical  Other Topics Concern   Not on file  Social History Narrative   Not on file   Social Determinants of Health   Financial Resource Strain: Not on file  Food Insecurity: Not on file  Transportation Needs: Not on file  Physical  Activity: Not on file  Stress: Not on file  Social Connections: Not on file     FAMILY HISTORY:  We obtained a detailed, 4-generation family history.  Significant diagnoses are listed below: Family History  Problem Relation Age of Onset   Breast cancer Mother        dx < 29   Breast cancer Sister 22   Ovarian cancer Sister 32   Breast cancer Sister        d. 36   Breast cancer Maternal Uncle    Breast cancer Maternal Grandmother        dx < 50   Pancreatic cancer Cousin        mat first cousin: reportedly BRCA1 pos     The patient has two daughters who are cancer free. She has three maternal half sisters and three maternal half brothers.  Two sisters had breast cancer under age 41 and one of them also  had ovarian cancer. The patient's mother had breast cancer under age 78, a maternal uncle had breast cancer, and a maternal cousin has pancreatic cancer and is reportedly positive for a BRCA1 mutation.  The patient's maternal grandmother also had breast cancer.  We did not get the paternal family history.  [name] is aware of previous family history of genetic testing for hereditary cancer risks. Patient's maternal ancestors are of African American descent, and paternal ancestors are of African American descent. There is no reported Ashkenazi Jewish ancestry. There is no known consanguinity.  GENETIC COUNSELING ASSESSMENT: [name] is a 61 y.o. female with a personal and family history of cancer which is somewhat suggestive of a hereditary breast and ovarian cancer and predisposition to cancer given the combination of cancer, ages of onset and the reported BRCA1 mutation. We, therefore, discussed and recommended the following at today's visit.   DISCUSSION: We discussed that, in general, most cancer is not inherited in families, but instead is sporadic or familial. Sporadic cancers occur by chance and typically happen at older ages (>50 years) as this type of cancer is caused by genetic changes acquired during an individuals lifetime. Some families have more cancers than would be expected by chance; however, the ages or types of cancer are not consistent with a known genetic mutation or known genetic mutations have been ruled out. This type of familial cancer is thought to be due to a combination of multiple genetic, environmental, hormonal, and lifestyle factors. While this combination of factors likely increases the risk of cancer, the exact source of this risk is not currently identifiable or testable.  We discussed that 5 - 10% of breast cancer is hereditary, with most cases associated with BRCA mutations.  There are other genes that can be associated with hereditary breast cancer syndromes.   These include ATM, CHEK2, and PALB2.  We discussed that testing is beneficial for several reasons including knowing how to follow individuals after completing their treatment, identifying whether potential treatment options such as PARP inhibitors would be beneficial, and understand if other family members could be at risk for cancer and allow them to undergo genetic testing.   We reviewed the characteristics, features and inheritance patterns of hereditary cancer syndromes. We also discussed genetic testing, including the appropriate family members to test, the process of testing, insurance coverage and turn-around-time for results. We discussed the implications of a negative, positive, carrier and/or variant of uncertain significant result. We recommended Ms. Pursel pursue genetic testing for the CancerNext-Expanded+RNAinsight gene panel.  The CancerNext-Expanded gene panel offered by [facility] and includes sequencing and rearrangement analysis for the following 77 genes: AIP, ALK, APC*, ATM*, AXIN2, BAP1, BARD1, BLM, BMPR1A, BRCA1*, BRCA2*, BRIP1*, CDC73, CDH1*, CDK4, CDKN1B, CDKN2A, CHEK2*, CTNNA1, DICER1, FANCC, FH, FLCN, GALNT12, KIF1B, LZTR1, MAX, MEN1, MET, MLH1*, MSH2*, MSH3, MSH6*, MUTYH*, NBN, NF1*, NF2, NTHL1, PALB2*, PHOX2B, PMS2*, POT1, PRKAR1A, PTCH1, PTEN*, RAD51C*, RAD51D*, RB1, RECQL, RET, SDHA, SDHAF2, SDHB, SDHC, SDHD, SMAD4, SMARCA4, SMARCB1, SMARCE1, STK11, SUFU, TMEM127, TP53*, TSC1, TSC2, VHL and XRCC2 (sequencing and deletion/duplication); EGFR, EGLN1, HOXB13, KIT, MITF, PDGFRA, POLD1, and POLE (sequencing only); EPCAM and GREM1 (deletion/duplication only). DNA and RNA analyses performed for * genes.   Based on Ms. Hannon's personal and family history of cancer, she meets medical criteria for genetic testing. Despite that she meets criteria, she may still have an out of pocket cost. We discussed that if her out of pocket cost for testing is over $100, the laboratory will call and  confirm whether she wants to proceed with testing.  If the out of pocket cost of testing is less than $100 she will be billed by the genetic testing laboratory.   PLAN: After considering the risks, benefits, and limitations, Ms. Meloche provided informed consent to pursue genetic testing and the blood sample was sent to [facility] for analysis of the CancerNext-Expanded+RNAinsight. Results should be available within approximately 2-3 weeks' time, at which point they will be disclosed by telephone to Ms. Graziani, as will any additional recommendations warranted by these results. Ms. Vanorman will receive a summary of her genetic counseling visit and a copy of her results once available. This information will also be available in Epic.   Lastly, we encouraged Ms. Chaudhary to remain in contact with cancer genetics annually so that we can continuously update the family history and inform her of any changes in cancer genetics and testing that may be of benefit for this family.   Ms. Eble questions were answered to her satisfaction today. Our contact information was provided should additional questions or concerns arise. Thank you for the referral and allowing [place] to share in the care of your patient.   Jacey Pelc P. Florene Glen, [place], [facility] Licensed, Insurance risk surveyor [email or web address] phone: [phone]  The patient was seen for a total of 30 minutes in face-to-face genetic counseling.  The patient brought her daughter. This patient was discussed with Drs. Magrinat, Lindi Adie and/or Burr Medico who agrees with the above.    _______________________________________________________________________ For Office Staff:  Number of people involved in session: 2 Was an Intern/ student involved with case: yes Criss Rosales

## 2022-01-27 ENCOUNTER — Telehealth: Payer: Self-pay | Admitting: Genetic Counselor

## 2022-01-27 ENCOUNTER — Encounter: Payer: Self-pay | Admitting: Genetic Counselor

## 2022-01-27 DIAGNOSIS — Z1379 Encounter for other screening for genetic and chromosomal anomalies: Secondary | ICD-10-CM | POA: Insufficient documentation

## 2022-01-27 NOTE — Telephone Encounter (Signed)
Revealed a positive genetic test on the CancerNext-Expanded.  She has a pathogenic mutation in [place].  This is consistent with the family history and the known BRCA1 mutation in her cousin.  She has declined a referral to the high risk clinic and does not feel that she needs to be seen back for genetic counseling. I confirmed that she has had a TAH-BSO and double mastectomy. ? ? ?

## 2022-03-22 ENCOUNTER — Encounter: Payer: Self-pay | Admitting: Genetic Counselor

## 2023-06-09 ENCOUNTER — Telehealth: Payer: Self-pay | Admitting: *Deleted

## 2023-06-09 NOTE — Telephone Encounter (Signed)
Patient  returned called to schedule new patient appt.

## 2023-06-12 NOTE — Telephone Encounter (Signed)
LMOM for patient to call and schedule npt appointment.

## 2023-07-10 ENCOUNTER — Ambulatory Visit
Admission: EM | Admit: 2023-07-10 | Discharge: 2023-07-10 | Disposition: A | Discharge status: Home / Self Care | Payer: Medicare Other | Attending: Nurse Practitioner | Admitting: Nurse Practitioner

## 2023-07-10 ENCOUNTER — Telehealth: Payer: Self-pay

## 2023-07-10 DIAGNOSIS — U071 COVID-19: Secondary | ICD-10-CM

## 2023-07-10 MED ORDER — PAXLOVID (150/100) 10 X 150 MG & 10 X 100MG PO TBPK: 2.0000 | ORAL_TABLET | Freq: Two times a day (BID) | ORAL | 0 refills | Status: DC

## 2023-07-10 MED ORDER — PAXLOVID (150/100) 10 X 150 MG & 10 X 100MG PO TBPK: 2.0000 | ORAL_TABLET | Freq: Two times a day (BID) | ORAL | 0 refills | Status: AC

## 2023-07-10 MED ORDER — MOLNUPIRAVIR EUA 200MG CAPSULE: 4.0000 | ORAL_CAPSULE | Freq: Two times a day (BID) | ORAL | 0 refills | Status: DC

## 2023-07-10 NOTE — ED Triage Notes (Signed)
Pt presents with sneezing, fatigue, headache,  congestion, cough that started Friday. Positive Covid test last night. Taking robitussin.

## 2023-07-10 NOTE — ED Provider Notes (Addendum)
[facility]    CSN: [id number] Arrival date & time: 07/10/23  0803      History   Chief Complaint Chief Complaint  Patient presents with   Covid Positive    HPI [name] is a 62 y.o. female.   The history is provided by the patient.   Patient presents after she had a positive home COVID test.  Patient states symptoms started 3 days ago.  She complains of headache, fatigue, nasal congestion, runny nose, sneezing, and cough.  She denies fever, chills, sore throat, wheezing, shortness of breath, difficulty breathing, chest pain, abdominal pain, nausea, vomiting, or diarrhea.  Patient states she is unsure of where she may have been exposed.  She states she did have a surprise birthday party for her husband prior to symptoms starting.  Patient reports she has been using an over-the-counter nasal spray and Robitussin for her symptoms.  Past Medical History:  Diagnosis Date   Adenocarcinoma of left breast ([facility])    double mastectomy 1990 then 2002    Anemia    Arthritis    rheumatoid   Asthma    BRCA1 positive, suspected 06/10/2014   Mother is BRCA1 +, sister died at 59 from breast cancer, another sister had breast cancer too.   Breast cancer ([facility])    double mastectomy 1990 then 2002   Chronic acquired lymphedema    COPD (chronic obstructive pulmonary disease) ([facility])    Family history of breast cancer    Family history of ovarian cancer    Family history of pancreatic cancer    High cholesterol    Infiltrating ductal carcinoma of right breast ([facility]) 06/10/2014   Personal history of malignant neoplasm of breast    S/P bilateral salpingo-oophorectomy 06/10/2014   August 2010    Patient Active Problem List   Diagnosis Date Noted   Genetic testing 01/27/2022   Family history of breast cancer 01/06/2022   Family history of ovarian cancer 01/06/2022   Family history of pancreatic cancer 01/06/2022   Personal history of malignant neoplasm of breast 01/06/2022    Infiltrating ductal carcinoma of right breast ([facility]) 06/10/2014   BRCA1 positive, suspected 06/10/2014   S/P bilateral salpingo-oophorectomy 06/10/2014   Cellulitis 03/26/2014   Nausea and vomiting 03/26/2014   Cellulitis of left arm 03/26/2014   Chronic acquired lymphedema 03/26/2014   COPD (chronic obstructive pulmonary disease) ([facility])    Arthritis    Anemia    High cholesterol    Adenocarcinoma of left breast ([facility])     Past Surgical History:  Procedure Laterality Date   ABDOMINAL HYSTERECTOMY     complete hysterectomy   BREAST SURGERY     double mastectomy     MASTECTOMY      OB History   No obstetric history on file.      Home Medications    Prior to Admission medications   Medication Sig Start Date End Date Taking? Authorizing Provider  albuterol (ACCUNEB) 0.63 MG/3ML nebulizer solution Take 1 ampule by nebulization 2 (two) times daily.   Yes [provider]  albuterol (PROVENTIL HFA;VENTOLIN HFA) 108 (90 BASE) MCG/ACT inhaler Inhale 2 puffs into the lungs every 4 (four) hours as needed.    Yes [provider]  Ferrous Gluconate (IRON 27 PO) Take 27 mg by mouth every other day.    Yes [provider]  fluticasone (FLONASE) 50 MCG/ACT nasal spray Place into both nostrils. 04/14/20  Yes [provider]  folic acid (FOLVITE)  1 MG tablet Take 1 mg by mouth daily.   Yes [provider]  furosemide (LASIX) 40 MG tablet Take 40 mg by mouth daily.  09/21/16  Yes [provider]  hydrochlorothiazide (HYDRODIURIL) 25 MG tablet Take 25 mg by mouth daily.   Yes [provider]  methotrexate (RHEUMATREX) 2.5 MG tablet Take 15 mg by mouth every Tuesday. Caution:Chemotherapy. Protect from light. (takes 4 tablets weekly)   Yes [provider]  molnupiravir EUA (LAGEVRIO) 200 mg CAPS capsule Take 4 capsules (800 mg total) by mouth 2 (two) times daily for 5 days. 07/10/23 07/15/23 Yes -Warren, Sadie Haber, NP   pravastatin (PRAVACHOL) 40 MG tablet Take 40 mg by mouth daily.   Yes [provider]  predniSONE (DELTASONE) 10 MG tablet Take 10 mg by mouth daily. For RA 08/15/16  Yes [provider]  predniSONE (DELTASONE) 10 MG tablet Take 4 tablets (40 mg total) by mouth daily. 01/01/19   Vanetta Mulders, MD    Family History Family History  Problem Relation Age of Onset   Breast cancer Mother        dx < 60   Breast cancer Sister 38   Ovarian cancer Sister 49   Breast cancer Sister        d. 65   Breast cancer Maternal Uncle    Breast cancer Maternal Grandmother        dx < 50   Pancreatic cancer Cousin        mat first cousin: reportedly BRCA1 pos    Social History Social History   Tobacco Use   Smoking status: Never   Smokeless tobacco: Never  Vaping Use   Vaping status: Never Used  Substance Use Topics   Alcohol use: No   Drug use: No     Allergies   Patient has no known allergies.   Review of Systems Review of Systems Per HPI  Physical Exam Triage Vital Signs ED Triage Vitals [07/10/23 0815]  Encounter Vitals Group     BP 116/77     Systolic BP Percentile      Diastolic BP Percentile      Pulse Rate 77     Resp 18     Temp 98.4 F (36.9 C)     Temp Source Oral     SpO2 95 %     Weight      Height      Head Circumference      Peak Flow      Pain Score 8     Pain Loc      Pain Education      Exclude from Growth Chart    No data found.  Updated Vital Signs BP 116/77 (BP Location: Right Arm)   Pulse 77   Temp 98.4 F (36.9 C) (Oral)   Resp 18   SpO2 95%   Visual Acuity Right Eye Distance:   Left Eye Distance:   Bilateral Distance:    Right Eye Near:   Left Eye Near:    Bilateral Near:     Physical Exam Vitals and nursing note reviewed.  Constitutional:      General: She is not in acute distress.    Appearance: Normal appearance.  HENT:     Head: Normocephalic.     Right Ear: Tympanic membrane, ear canal and external  ear normal.     Left Ear: Tympanic membrane, ear canal and external ear normal.     Nose: Congestion  present.     Right Turbinates: Enlarged and swollen.     Left Turbinates: Enlarged and swollen.     Right Sinus: No maxillary sinus tenderness or frontal sinus tenderness.     Left Sinus: No maxillary sinus tenderness or frontal sinus tenderness.     Mouth/Throat:     Lips: Pink.     Mouth: Mucous membranes are moist.     Pharynx: Oropharynx is clear. Uvula midline. Posterior oropharyngeal erythema present. No pharyngeal swelling, oropharyngeal exudate or uvula swelling.     Comments: Cobblestoning present to posterior oropharynx Eyes:     Extraocular Movements: Extraocular movements intact.     Conjunctiva/sclera: Conjunctivae normal.     Pupils: Pupils are equal, round, and reactive to light.  Cardiovascular:     Rate and Rhythm: Normal rate and regular rhythm.     Pulses: Normal pulses.     Heart sounds: Normal heart sounds.  Pulmonary:     Effort: Pulmonary effort is normal. No respiratory distress.     Breath sounds: Normal breath sounds. No stridor. No wheezing, rhonchi or rales.  Abdominal:     General: Bowel sounds are normal.     Palpations: Abdomen is soft.     Tenderness: There is no abdominal tenderness.  Musculoskeletal:     Cervical back: Normal range of motion.  Lymphadenopathy:     Cervical: No cervical adenopathy.  Skin:    General: Skin is warm and dry.  Neurological:     General: No focal deficit present.     Mental Status: She is alert and oriented to person, place, and time.  Psychiatric:        Mood and Affect: Mood normal.        Behavior: Behavior normal.      UC Treatments / Results  Labs (all labs ordered are listed, but only abnormal results are displayed) Labs Reviewed - No data to display  EKG   Radiology No results found.  Procedures Procedures (including critical care time)  Medications Ordered in UC Medications - No data to  display  Initial Impression / Assessment and Plan / UC Course  I have reviewed the triage vital signs and the nursing notes.  Pertinent labs & imaging results that were available during my care of the patient were reviewed by me and considered in my medical decision making (see chart for details).  The patient is well-appearing, she is in no acute distress, vital signs are stable.  Patient with positive self-administered home COVID test.  No recent lab work on the patient's chart to provide current creatinine.  For safety, renal dose of Paxlovid was prescribed.  Patient is currently taken over-the-counter medications for her other symptoms which she would like to continue.  Supportive care recommendations were provided and discussed with the patient to include increasing fluids, allowing for plenty of rest, and remaining home until she has been fever free for at least 24 hours.  Patient was given strict ER follow-up precautions.  Patient also advised to follow-up with her primary care physician regarding her positive COVID diagnosis.  Patient is in agreement with this plan of care and verbalizes understanding.  All questions were answered.  Patient stable for discharge.  Final Clinical Impressions(s) / UC Diagnoses   Final diagnoses:  Positive self-administered antigen test for COVID-19  COVID     Discharge Instructions      Take medication as prescribed.  Continue using your over-the-counter nasal spray and Robitussin as discussed. Increase  fluids and allow for plenty of rest. May take over-the-counter Tylenol or ibuprofen as needed for pain, fever, general discomfort. Recommend using a humidifier in your bedroom at nighttime during sleep and sleeping elevated on pillows while cough symptoms persist. As discussed, you will take the medication prescribed today for 5 days.  Continue to wear your mask while you are taking the medication.  If you continue to experience symptoms after  completing the medication, wear your mask for an additional 5 days. If you develop fever, you should remain home until you have been fever free for 24 hours with no medication. Go to the emergency department immediately if you experience a high fever, worsening cough with shortness of breath, difficulty breathing, or other concerns. Please follow-up with your primary care physician to inform them of your positive COVID diagnosis. Follow-up as needed.     ED Prescriptions     Medication Sig Dispense Auth. Provider   molnupiravir EUA (LAGEVRIO) 200 mg CAPS capsule Take 4 capsules (800 mg total) by mouth 2 (two) times daily for 5 days. 40 capsule -Warren, Sadie Haber, NP      PDMP not reviewed this encounter.   Abran Cantor, NP 07/10/23 4010    Abran Cantor, NP 07/10/23 1005

## 2023-07-10 NOTE — Discharge Instructions (Addendum)
Take medication as prescribed.  Continue using your over-the-counter nasal spray and Robitussin as discussed. Increase fluids and allow for plenty of rest. May take over-the-counter Tylenol or ibuprofen as needed for pain, fever, general discomfort. Recommend using a humidifier in your bedroom at nighttime during sleep and sleeping elevated on pillows while cough symptoms persist. As discussed, you will take the medication prescribed today for 5 days.  Continue to wear your mask while you are taking the medication.  If you continue to experience symptoms after completing the medication, wear your mask for an additional 5 days. If you develop fever, you should remain home until you have been fever free for 24 hours with no medication. Go to the emergency department immediately if you experience a high fever, worsening cough with shortness of breath, difficulty breathing, or other concerns. Please follow-up with your primary care physician to inform them of your positive COVID diagnosis. Follow-up as needed.

## 2023-08-31 ENCOUNTER — Encounter: Payer: Self-pay | Admitting: Family Medicine

## 2023-08-31 ENCOUNTER — Ambulatory Visit (INDEPENDENT_AMBULATORY_CARE_PROVIDER_SITE_OTHER): Payer: Medicare Other | Admitting: Family Medicine

## 2023-08-31 VITALS — BP 132/84 | HR 75 | Ht 60.5 in | Wt 185.1 lb

## 2023-08-31 DIAGNOSIS — E559 Vitamin D deficiency, unspecified: Secondary | ICD-10-CM

## 2023-08-31 DIAGNOSIS — Z1159 Encounter for screening for other viral diseases: Secondary | ICD-10-CM | POA: Diagnosis not present

## 2023-08-31 DIAGNOSIS — M1990 Unspecified osteoarthritis, unspecified site: Secondary | ICD-10-CM | POA: Diagnosis not present

## 2023-08-31 DIAGNOSIS — R7301 Impaired fasting glucose: Secondary | ICD-10-CM | POA: Diagnosis not present

## 2023-08-31 DIAGNOSIS — E7849 Other hyperlipidemia: Secondary | ICD-10-CM

## 2023-08-31 DIAGNOSIS — E038 Other specified hypothyroidism: Secondary | ICD-10-CM

## 2023-08-31 DIAGNOSIS — Z114 Encounter for screening for human immunodeficiency virus [HIV]: Secondary | ICD-10-CM

## 2023-08-31 NOTE — Progress Notes (Signed)
New Patient Office Visit  Subjective:  Patient ID: [name], female    DOB: 1961-11-11  Age: 62 y.o. MRN: [id number]  CC:  Chief Complaint  Patient presents with   New Patient (Initial Visit)    Establishing care, was previously seen by Dr. [name]. Last labs were done at the end of July.     HPI [name] is a 61 y.o. female with past medical history of COPD, arthritis, high cholesterol, presents for establishing care. For the details of today's visit, please refer to the assessment and plan.    Past Medical History:  Diagnosis Date   Adenocarcinoma of left breast (HCC)    double mastectomy 1990 then 2002    Anemia    Arthritis    rheumatoid   Asthma    BRCA1 positive, suspected 06/10/2014   Mother is BRCA1 +, sister died at 54 from breast cancer, another sister had breast cancer too.   Breast cancer (HCC)    double mastectomy 1990 then 2002   Chronic acquired lymphedema    COPD (chronic obstructive pulmonary disease) (HCC)    Family history of breast cancer    Family history of ovarian cancer    Family history of pancreatic cancer    High cholesterol    Infiltrating ductal carcinoma of right breast (HCC) 06/10/2014   Personal history of malignant neoplasm of breast    S/P bilateral salpingo-oophorectomy 06/10/2014   August 2010    Past Surgical History:  Procedure Laterality Date   ABDOMINAL HYSTERECTOMY     complete hysterectomy   BREAST SURGERY     double mastectomy     MASTECTOMY      Family History  Problem Relation Age of Onset   Breast cancer Mother        dx < 65   Breast cancer Sister 68   Ovarian cancer Sister 61   Breast cancer Sister        d. 20   Breast cancer Maternal Uncle    Breast cancer Maternal Grandmother        dx < 50   Pancreatic cancer Cousin        mat first cousin: reportedly BRCA1 pos    Social History   Socioeconomic History   Marital status: Married    Spouse name: Not on file   Number of children: Not on  file   Years of education: Not on file   Highest education level: Not on file  Occupational History   Not on file  Tobacco Use   Smoking status: Never   Smokeless tobacco: Never  Vaping Use   Vaping status: Never Used  Substance and Sexual Activity   Alcohol use: No   Drug use: No   Sexual activity: Yes    Birth control/protection: Surgical  Other Topics Concern   Not on file  Social History Narrative   Not on file   Social Determinants of Health   Financial Resource Strain: Not on file  Food Insecurity: Not on file  Transportation Needs: Not on file  Physical Activity: Not on file  Stress: Not on file  Social Connections: Not on file  Intimate Partner Violence: Not on file    ROS Review of Systems  Constitutional:  Negative for chills and fever.  Eyes:  Negative for visual disturbance.  Respiratory:  Negative for chest tightness and shortness of breath.   Neurological:  Negative for dizziness and headaches.    Objective:  Today's Vitals: BP 132/84   Pulse 75   Ht 5' 0.5" (1.537 m)   Wt 185 lb 1.9 oz (84 kg)   SpO2 97%   BMI 35.56 kg/m   Physical Exam HENT:     Head: Normocephalic.     Mouth/Throat:     Mouth: Mucous membranes are moist.  Cardiovascular:     Rate and Rhythm: Normal rate.     Heart sounds: Normal heart sounds.  Pulmonary:     Effort: Pulmonary effort is normal.     Breath sounds: Normal breath sounds.  Neurological:     Mental Status: She is alert.      Assessment & Plan:   Arthritis Assessment & Plan: The patient reports experiencing an achy sensation in her shoulder due to recent weather changes, likely related to her arthritis. She states that the pain is often relieved with over-the-counter NSAIDs. She is encouraged to continue stretching and strengthening exercises, apply heat, and take over-the-counter NSAIDs as needed for pain relief. She denies any numbness, tingling, recent trauma, or injury.   IFG (impaired fasting  glucose) -     Hemoglobin A1c  Vitamin D deficiency -     VITAMIN D 25 Hydroxy (Vit-D Deficiency, Fractures)  Need for hepatitis C screening test -     Hepatitis C antibody  Encounter for screening for HIV -     HIV Antibody (routine testing w rflx)  Other specified hypothyroidism -     TSH + free T4  Other hyperlipidemia -     Lipid panel -     CMP14+EGFR -     CBC with Differential/Platelet  Note: This chart has been completed using Engineer, civil (consulting) software, and while attempts have been made to ensure accuracy, certain words and phrases may not be transcribed as intended.     Follow-up: Return in about 4 months (around 01/01/2024).   Gilmore Laroche, FNP

## 2023-08-31 NOTE — Patient Instructions (Signed)
I appreciate the opportunity to provide care to you today!    Follow up:  4 months  Labs: please stop by the lab today to get your blood drawn (CBC, CMP, TSH, Lipid profile, HgA1c, Vit D)  Screening: HIV and Hep C  Attached with your AVS, you will find valuable resources for self-education. I highly recommend dedicating some time to thoroughly examine them.   Please continue to a heart-healthy diet and increase your physical activities. Try to exercise for at least five days a week.    It was a pleasure to see you and I look forward to continuing to work together on your health and well-being. Please do not hesitate to call the office if you need care or have questions about your care.  In case of emergency, please visit the Emergency Department for urgent care, or contact our clinic at [phone] to schedule an appointment. We're here to help you!   Have a wonderful day and week. With Gratitude, Gilmore Laroche MSN, FNP-BC

## 2023-09-01 LAB — CBC WITH DIFFERENTIAL/PLATELET
Basophils Absolute: 0 10*3/uL (ref 0.0–0.2)
Basos: 1 %
EOS (ABSOLUTE): 0.1 10*3/uL (ref 0.0–0.4)
Eos: 2 %
Hematocrit: 40.9 % (ref 34.0–46.6)
Hemoglobin: 12.9 g/dL (ref 11.1–15.9)
Immature Grans (Abs): 0 10*3/uL (ref 0.0–0.1)
Immature Granulocytes: 0 %
Lymphocytes Absolute: 0.9 10*3/uL (ref 0.7–3.1)
Lymphs: 33 %
MCH: 25.4 pg — ABNORMAL LOW (ref 26.6–33.0)
MCHC: 31.5 g/dL (ref 31.5–35.7)
MCV: 81 fL (ref 79–97)
Monocytes Absolute: 0.4 10*3/uL (ref 0.1–0.9)
Monocytes: 14 %
Neutrophils Absolute: 1.4 10*3/uL (ref 1.4–7.0)
Neutrophils: 50 %
Platelets: 221 10*3/uL (ref 150–450)
RBC: 5.07 x10E6/uL (ref 3.77–5.28)
RDW: 16.2 % — ABNORMAL HIGH (ref 11.7–15.4)
WBC: 2.7 10*3/uL — ABNORMAL LOW (ref 3.4–10.8)

## 2023-09-01 LAB — CMP14+EGFR
ALT: 91 [IU]/L — ABNORMAL HIGH (ref 0–32)
AST: 69 [IU]/L — ABNORMAL HIGH (ref 0–40)
Albumin: 4 g/dL (ref 3.9–4.9)
Alkaline Phosphatase: 125 [IU]/L — ABNORMAL HIGH (ref 44–121)
BUN/Creatinine Ratio: 21 (ref 12–28)
BUN: 14 mg/dL (ref 8–27)
Bilirubin Total: 0.5 mg/dL (ref 0.0–1.2)
CO2: 26 mmol/L (ref 20–29)
Calcium: 10.9 mg/dL — ABNORMAL HIGH (ref 8.7–10.3)
Chloride: 100 mmol/L (ref 96–106)
Creatinine, Ser: 0.68 mg/dL (ref 0.57–1.00)
Globulin, Total: 2.8 g/dL (ref 1.5–4.5)
Glucose: 91 mg/dL (ref 70–99)
Potassium: 4.2 mmol/L (ref 3.5–5.2)
Sodium: 138 mmol/L (ref 134–144)
Total Protein: 6.8 g/dL (ref 6.0–8.5)
eGFR: 97 mL/min/{1.73_m2} (ref >59)

## 2023-09-01 LAB — LIPID PANEL
Chol/HDL Ratio: 2.6 {ratio} (ref 0.0–4.4)
Cholesterol, Total: 180 mg/dL (ref 100–199)
HDL: 68 mg/dL (ref >39)
LDL Chol Calc (NIH): 99 mg/dL (ref 0–99)
Triglycerides: 69 mg/dL (ref 0–149)
VLDL Cholesterol Cal: 13 mg/dL (ref 5–40)

## 2023-09-01 LAB — HEPATITIS C ANTIBODY: Hep C Virus Ab: NONREACTIVE

## 2023-09-01 LAB — TSH+FREE T4
Free T4: 0.95 ng/dL (ref 0.82–1.77)
TSH: 3.2 u[IU]/mL (ref 0.450–4.500)

## 2023-09-01 LAB — VITAMIN D 25 HYDROXY (VIT D DEFICIENCY, FRACTURES): Vit D, 25-Hydroxy: 36 ng/mL (ref 30.0–100.0)

## 2023-09-01 LAB — HEMOGLOBIN A1C
Est. average glucose Bld gHb Est-mCnc: 126 mg/dL
Hgb A1c MFr Bld: 6 % — ABNORMAL HIGH (ref 4.8–5.6)

## 2023-09-01 LAB — HIV ANTIBODY (ROUTINE TESTING W REFLEX): HIV Screen 4th Generation wRfx: NONREACTIVE

## 2023-09-02 NOTE — Assessment & Plan Note (Signed)
The patient reports experiencing an achy sensation in her shoulder due to recent weather changes, likely related to her arthritis. She states that the pain is often relieved with over-the-counter NSAIDs. She is encouraged to continue stretching and strengthening exercises, apply heat, and take over-the-counter NSAIDs as needed for pain relief. She denies any numbness, tingling, recent trauma, or injury.

## 2023-09-06 ENCOUNTER — Other Ambulatory Visit: Payer: Self-pay | Admitting: Family Medicine

## 2023-09-06 DIAGNOSIS — D72819 Decreased white blood cell count, unspecified: Secondary | ICD-10-CM

## 2023-09-12 DIAGNOSIS — D72819 Decreased white blood cell count, unspecified: Secondary | ICD-10-CM | POA: Diagnosis not present

## 2023-09-13 ENCOUNTER — Encounter: Payer: Self-pay | Admitting: Family Medicine

## 2023-09-13 LAB — CBC WITH DIFFERENTIAL/PLATELET
Basophils Absolute: 0 10*3/uL (ref 0.0–0.2)
Basos: 1 %
EOS (ABSOLUTE): 0.1 10*3/uL (ref 0.0–0.4)
Eos: 2 %
Hematocrit: 40.4 % (ref 34.0–46.6)
Hemoglobin: 12.8 g/dL (ref 11.1–15.9)
Immature Grans (Abs): 0 10*3/uL (ref 0.0–0.1)
Immature Granulocytes: 0 %
Lymphocytes Absolute: 1.2 10*3/uL (ref 0.7–3.1)
Lymphs: 32 %
MCH: 25.7 pg — ABNORMAL LOW (ref 26.6–33.0)
MCHC: 31.7 g/dL (ref 31.5–35.7)
MCV: 81 fL (ref 79–97)
Monocytes Absolute: 0.5 10*3/uL (ref 0.1–0.9)
Monocytes: 14 %
Neutrophils Absolute: 2 10*3/uL (ref 1.4–7.0)
Neutrophils: 51 %
Platelets: 231 10*3/uL (ref 150–450)
RBC: 4.99 x10E6/uL (ref 3.77–5.28)
RDW: 16.2 % — ABNORMAL HIGH (ref 11.7–15.4)
WBC: 3.9 10*3/uL (ref 3.4–10.8)

## 2023-11-02 NOTE — Progress Notes (Signed)
Office Visit Note  Patient: [name]             Date of Birth: [birth year]           MRN: [id number]             PCP: Gilmore Laroche, FNP Referring: Gareth Morgan, MD Visit Date: 11/03/2023   Subjective:  New Patient (Initial Visit) (Patient states she has joint pain in her fingers, shoulders, and legs. Patient states her pain depends on how bad she is. Patient states most of her pain is in her shoulder, arms, and hands. )   Discussed the use of AI scribe software for clinical note transcription with the patient, who gave verbal consent to proceed.  History of Present Illness   [name] is a 62 y.o. female here to establish care for rheumatoid arthritis. She has been on methotrexate 20 mg PO weekly and prednisone 5 mg daily managed through her PCP office but with persistent symptoms involving both hands and has bene unable to taper or discontinue steroid use. The patient has been experiencing these symptoms for over a decade, with the onset around 2009-2013. The pain initially presented in the legs, impairing mobility, and later spread to the hands, causing difficulty in closing them, and the shoulders, limiting arm movement. The patient also reported difficulty opening their mouth due to jaw pain. She was evaluated apparently by exam and xrays indicated rheumatoid arthritis. She started on treatment initially with methotrexate and with intermittent prednisone tapers. The patient has been on methotrexate for approximately ten years. She also takes aleve intermittently for pain as needed.  In October, the patient experienced a severe flare-up, with significant hand swelling and pain that lasted for about a week and a half. This flare-up also affected the patient's shoulders and arms, causing them to become hot and swollen. The patient also reported a burning, tingling sensation in the arms lasting for several hours.  The patient has a history of breast cancer treated with  chemotherapy, radiation, and mastectomy, which has led to numbness in the tips of the fingers and toes. The patient also has lymphedema, which has caused swelling in the arms. The patient manages this with a body suit for compression and circulation.   Activities of Daily Living:  Patient reports morning stiffness for 10 minutes.   Patient Reports nocturnal pain.  Difficulty dressing/grooming: Reports Difficulty climbing stairs: Reports Difficulty getting out of chair: Reports Difficulty using hands for taps, buttons, cutlery, and/or writing: Reports  Review of Systems  Constitutional:  Negative for fatigue.  HENT:  Negative for mouth sores and mouth dryness.   Eyes:  Negative for dryness.  Respiratory:  Positive for shortness of breath.   Cardiovascular:  Positive for chest pain. Negative for palpitations.  Gastrointestinal:  Negative for blood in stool, constipation and diarrhea.  Endocrine: Negative for increased urination.  Genitourinary:  Negative for involuntary urination.  Musculoskeletal:  Positive for joint pain, joint pain, joint swelling, myalgias, morning stiffness, muscle tenderness and myalgias. Negative for gait problem and muscle weakness.  Skin:  Negative for color change, rash, hair loss and sensitivity to sunlight.  Allergic/Immunologic: Positive for susceptible to infections.  Neurological:  Negative for dizziness and headaches.  Hematological:  Negative for swollen glands.  Psychiatric/Behavioral:  Negative for depressed mood and sleep disturbance. The patient is not nervous/anxious.     PMFS History:  Patient Active Problem List   Diagnosis Date Noted   High risk medication use  11/03/2023   Genetic testing 01/27/2022   Family history of breast cancer 01/06/2022   Family history of ovarian cancer 01/06/2022   Family history of pancreatic cancer 01/06/2022   Personal history of malignant neoplasm of breast 01/06/2022   Infiltrating ductal carcinoma of right  breast (HCC) 06/10/2014   BRCA1 positive, suspected 06/10/2014   S/P bilateral salpingo-oophorectomy 06/10/2014   Cellulitis 03/26/2014   Nausea and vomiting 03/26/2014   Cellulitis of left arm 03/26/2014   Chronic acquired lymphedema 03/26/2014   COPD (chronic obstructive pulmonary disease) (HCC)    Rheumatoid arthritis (HCC)    Anemia    High cholesterol    Adenocarcinoma of left breast [facility])     Past Medical History:  Diagnosis Date   Adenocarcinoma of left breast (HCC)    double mastectomy 1990 then 2002    Anemia    Arthritis    rheumatoid   Asthma    BRCA1 positive, suspected 06/10/2014   Mother is BRCA1 +, sister died at 61 from breast cancer, another sister had breast cancer too.   Breast cancer (HCC)    double mastectomy 1990 then 2002   Chronic acquired lymphedema    COPD (chronic obstructive pulmonary disease) (HCC)    Family history of breast cancer    Family history of ovarian cancer    Family history of pancreatic cancer    High cholesterol    Infiltrating ductal carcinoma of right breast (HCC) 06/10/2014   Personal history of malignant neoplasm of breast    S/P bilateral salpingo-oophorectomy 06/10/2014   August 2010    Family History  Problem Relation Age of Onset   Breast cancer Mother        dx < 90   Breast cancer Sister 53   Ovarian cancer Sister 69   Breast cancer Sister        d. 65   Breast cancer Maternal Uncle    Breast cancer Maternal Grandmother        dx < 50   Pancreatic cancer Cousin        mat first cousin: reportedly BRCA1 pos   Past Surgical History:  Procedure Laterality Date   ABDOMINAL HYSTERECTOMY     complete hysterectomy   BREAST SURGERY     double mastectomy     MASTECTOMY     Social History   Social History Narrative   Not on file   Immunization History  Administered Date(s) Administered   Zoster Recombinant(Shingrix) 04/08/2022, 10/04/2022     Objective: Vital Signs: BP 125/70 (BP Location: Right Arm,  Patient Position: Sitting, Cuff Size: Normal) Comment (BP Location): lower arm  Pulse 61   Resp 14   Ht 5' (1.524 m)   Wt 189 lb (85.7 kg)   BMI 36.91 kg/m    Physical Exam Constitutional:      Appearance: She is obese.  Eyes:     Conjunctiva/sclera: Conjunctivae normal.  Cardiovascular:     Rate and Rhythm: Normal rate and regular rhythm.  Pulmonary:     Effort: Pulmonary effort is normal.     Breath sounds: Normal breath sounds.  Musculoskeletal:     Comments: B/l 1+ pitting edema  Lymphadenopathy:     Cervical: No cervical adenopathy.  Skin:    General: Skin is warm and dry.     Comments: Bilateral arm lymphedema, with skin induration on forearms and dorsum of both hands  Neurological:     Mental Status: She is alert.  Psychiatric:  Mood and Affect: Mood normal.      Musculoskeletal Exam:  Shoulders full ROM, left side tenderness at [employer] joint, no palpable effusion Elbows full ROM no tenderness or swelling Wrists full ROM, right wrist is tender to pressure, percussion over carpal tunnel radiating to hand and arm Fingers tender to pressure without joint swelling No paraspinal tenderness to palpation over upper and lower back Hip normal internal and external rotation without pain, no tenderness to lateral hip palpation Knees full ROM no tenderness or swelling Ankles full ROM no tenderness or swelling   Investigation: No additional findings.  Imaging: XR Hand 2 View Left  Result Date: 11/03/2023 X-ray left hand 2 views Radiocarpal joint space appears normal.  Moderate first CMC joint degenerative arthritis with mild subluxation.  MCP joint spaces appear preserved.  Mild PIP and DIP marginal osteophytes.  Bone mineralization consistent with generalized osteopenia. Impression There is generalized osteopenia that could be secondary to inflammatory Tritus versus systemic osteoporosis, otherwise mild to moderate osteoarthritis involving first CMC and distal finger  joints  XR Hand 2 View Right  Result Date: 11/03/2023 X-ray right hand 2 views Radiocarpal joint space appears normal possible widening of distance between scaphoid and lunate.  Numerous cystic changes present throughout the carpal bones without definite erosions.  Mild degenerative changes of first CMC joint without significant subluxation.  Marginal osteophytes of second and third MCP joints with no erosion or subluxation.  Early marginal osteophytes at multiple PIP and DIP joints.  Bone mineralization consistent with generalized osteopenia. Impression Extensive proximal cystic changes and demineralization consistent with chronic inflammatory arthritis, with mild distal finger joint degenerative changes  XR Shoulder Right  Result Date: 11/03/2023 X-ray right shoulder 4 views Normal internal and external rotation position of the humerus.  Glenohumeral joint space appears normal.  Acromiohumeral distance is normal.  There is calcifications present in the subacromial space best viewed on internal rotation view.  AC joint with appears normal.  No visible joint effusion. Impression No significant appearing osteoarthritis, calcifications below the acromion look consistent for chronic rotator cuff pathology or bursitis  XR Shoulder Left  Result Date: 11/03/2023 X-ray left shoulder 4 views Glenohumeral joint space appears normal.  Normal internal and external rotation position of humerus.  Acromiohumeral distance is normal there does appear to be some hypodensity and calcification present in the subacromial space.  No visible effusion.  AC joint width is normal.  Numerous surgical clips in place consistent with history of mastectomy. Impression No significant appearing AC or glenohumeral joint osteoarthritis, look suspicious for some chronic rotator cuff pathology or subacromial bursitis with without decreased AHD   Recent Labs: Lab Results  Component Value Date   WBC 6.4 11/03/2023   HGB 13.5 11/03/2023    PLT 252 11/03/2023   NA 140 11/03/2023   K 4.4 11/03/2023   CL 103 11/03/2023   CO2 31 11/03/2023   GLUCOSE 88 11/03/2023   BUN 11 11/03/2023   CREATININE 0.74 11/03/2023   BILITOT 0.6 11/03/2023   ALKPHOS 125 (H) 08/31/2023   AST 32 11/03/2023   ALT 66 (H) 11/03/2023   PROT 7.1 11/03/2023   ALBUMIN 4.0 08/31/2023   CALCIUM 11.3 (H) 11/03/2023   GFRAA >60 07/22/2020    Speciality Comments: No specialty comments available.  Procedures:  No procedures performed Allergies: Patient has no known allergies.   Assessment / Plan:     Visit Diagnoses: Rheumatoid arthritis involving multiple sites, unspecified whether rheumatoid factor present (HCC) - Plan: XR Hand  2 View Right, XR Hand 2 View Left, XR Shoulder Right, XR Shoulder Left, Sedimentation rate, C-reactive protein, Rheumatoid factor, Cyclic citrul peptide antibody, IgG  High risk medication use - Plan: CBC with Differential/Platelet, COMPLETE METABOLIC PANEL WITH GFR, QuantiFERON-TB Gold Plus  Rheumatoid Arthritis Longstanding disease with persistent symptoms despite Methotrexate and Prednisone. Recent flare with hand and shoulder involvement. Also, potential nerve impingement due to wrist swelling.  X-rays of shoulders with subacromial space calcifications consistent for calcific tendinitis, previous tears, or chronic subacromial bursitis.  Hand x-rays negative for erosive disease. -Order hand and shoulder X-rays to assess for progression of RA joint damage. -Draw blood for liver function tests and RA disease activity markers. -Continue the current methotrexate 15 mg p.o. weekly folic acid 1 mg daily prednisone 5 mg daily for now will discuss treatment changes at follow-up  Methotrexate-induced Hepatotoxicity Elevated liver enzymes on recent blood tests while on Methotrexate. -Repeat liver function tests. -Consider alternative RA medications if liver enzymes remain elevated. -Also checking QuantiFERON baseline screening if  needing to start biologic as alternative due to methotrexate incomplete response and or side effect  Lymphedema Chronic issue, contributing to postural changes and potential shoulder pain. -Encourage continuation of compression therapy and exercises for scapular stabilization.    Orders: Orders Placed This Encounter  Procedures   XR Hand 2 View Right   XR Hand 2 View Left   XR Shoulder Right   XR Shoulder Left   Sedimentation rate   C-reactive protein   CBC with Differential/Platelet   COMPLETE METABOLIC PANEL WITH GFR   QuantiFERON-TB Gold Plus   Rheumatoid factor   Cyclic citrul peptide antibody, IgG   No orders of the defined types were placed in this encounter.   Follow-Up Instructions: Return in about 4 weeks (around 12/01/2023) for New pt RA on MTX/GC f/u 16mo.   Fuller Plan, MD  Note - This record has been created using [employer].  Chart creation errors have been sought, but may not always  have been located. Such creation errors do not reflect on  the standard of medical care.

## 2023-11-03 ENCOUNTER — Ambulatory Visit: Payer: Medicare Other

## 2023-11-03 ENCOUNTER — Encounter: Payer: Self-pay | Admitting: Internal Medicine

## 2023-11-03 ENCOUNTER — Ambulatory Visit: Payer: Medicare Other | Attending: Internal Medicine | Admitting: Internal Medicine

## 2023-11-03 VITALS — BP 125/70 | HR 61 | Resp 14 | Ht 60.0 in | Wt 189.0 lb

## 2023-11-03 DIAGNOSIS — M069 Rheumatoid arthritis, unspecified: Secondary | ICD-10-CM | POA: Diagnosis not present

## 2023-11-03 DIAGNOSIS — M25511 Pain in right shoulder: Secondary | ICD-10-CM | POA: Diagnosis not present

## 2023-11-03 DIAGNOSIS — M79642 Pain in left hand: Secondary | ICD-10-CM

## 2023-11-03 DIAGNOSIS — I890 Lymphedema, not elsewhere classified: Secondary | ICD-10-CM

## 2023-11-03 DIAGNOSIS — Z79899 Other long term (current) drug therapy: Secondary | ICD-10-CM | POA: Diagnosis not present

## 2023-11-03 DIAGNOSIS — M25512 Pain in left shoulder: Secondary | ICD-10-CM | POA: Diagnosis not present

## 2023-11-03 DIAGNOSIS — M79641 Pain in right hand: Secondary | ICD-10-CM

## 2023-11-08 LAB — CYCLIC CITRUL PEPTIDE ANTIBODY, IGG: Cyclic Citrullin Peptide Ab: >250 U — ABNORMAL HIGH

## 2023-11-08 LAB — COMPLETE METABOLIC PANEL WITH GFR
AG Ratio: 1.2 (calc) (ref 1.0–2.5)
ALT: 66 U/L — ABNORMAL HIGH (ref 6–29)
AST: 32 U/L (ref 10–35)
Albumin: 3.9 g/dL (ref 3.6–5.1)
Alkaline phosphatase (APISO): 102 U/L (ref 37–153)
BUN: 11 mg/dL (ref 7–25)
CO2: 31 mmol/L (ref 20–32)
Calcium: 11.3 mg/dL — ABNORMAL HIGH (ref 8.6–10.4)
Chloride: 103 mmol/L (ref 98–110)
Creat: 0.74 mg/dL (ref 0.50–1.05)
Globulin: 3.2 g/dL (ref 1.9–3.7)
Glucose, Bld: 88 mg/dL (ref 65–99)
Potassium: 4.4 mmol/L (ref 3.5–5.3)
Sodium: 140 mmol/L (ref 135–146)
Total Bilirubin: 0.6 mg/dL (ref 0.2–1.2)
Total Protein: 7.1 g/dL (ref 6.1–8.1)
eGFR: 90 mL/min/{1.73_m2} (ref >=60)

## 2023-11-08 LAB — CBC WITH DIFFERENTIAL/PLATELET
Absolute Lymphocytes: 2240 {cells}/uL (ref 850–3900)
Absolute Monocytes: 474 {cells}/uL (ref 200–950)
Basophils Absolute: 51 {cells}/uL (ref 0–200)
Basophils Relative: 0.8 %
Eosinophils Absolute: 58 {cells}/uL (ref 15–500)
Eosinophils Relative: 0.9 %
HCT: 42.5 % (ref 35.0–45.0)
Hemoglobin: 13.5 g/dL (ref 11.7–15.5)
MCH: 25.6 pg — ABNORMAL LOW (ref 27.0–33.0)
MCHC: 31.8 g/dL — ABNORMAL LOW (ref 32.0–36.0)
MCV: 80.5 fL (ref 80.0–100.0)
MPV: 11.4 fL (ref 7.5–12.5)
Monocytes Relative: 7.4 %
Neutro Abs: 3578 {cells}/uL (ref 1500–7800)
Neutrophils Relative %: 55.9 %
Platelets: 252 10*3/uL (ref 140–400)
RBC: 5.28 10*6/uL — ABNORMAL HIGH (ref 3.80–5.10)
RDW: 15.6 % — ABNORMAL HIGH (ref 11.0–15.0)
Total Lymphocyte: 35 %
WBC: 6.4 10*3/uL (ref 3.8–10.8)

## 2023-11-08 LAB — QUANTIFERON-TB GOLD PLUS
Mitogen-NIL: 4.35 [IU]/mL
NIL: 0.04 [IU]/mL
QuantiFERON-TB Gold Plus: NEGATIVE
TB1-NIL: 0 [IU]/mL
TB2-NIL: 0 [IU]/mL

## 2023-11-08 LAB — C-REACTIVE PROTEIN: CRP: <3 mg/L (ref <8.0)

## 2023-11-08 LAB — SEDIMENTATION RATE: Sed Rate: 17 mm/h (ref 0–30)

## 2023-11-08 LAB — RHEUMATOID FACTOR: Rheumatoid fact SerPl-aCnc: 380 [IU]/mL — ABNORMAL HIGH (ref <14)

## 2023-11-13 NOTE — Progress Notes (Signed)
She can keep taking her vitamin D supplement for now. We can check this next time to see if any adjustment is needed but that supplement would not cause high calcium as long as it is just vitamin D. The RF and CCP are the antibodies causing rheumatoid arthritis. The medicine such as methotrexate works to stop these from causing inflammation or damage in the joints, but does not get rid of the antibodies themselves.

## 2024-01-01 ENCOUNTER — Ambulatory Visit (INDEPENDENT_AMBULATORY_CARE_PROVIDER_SITE_OTHER): Payer: Medicare Other | Admitting: Family Medicine

## 2024-01-01 ENCOUNTER — Encounter: Payer: Self-pay | Admitting: Family Medicine

## 2024-01-01 VITALS — BP 137/79 | HR 71 | Ht 60.0 in | Wt 193.1 lb

## 2024-01-01 DIAGNOSIS — K219 Gastro-esophageal reflux disease without esophagitis: Secondary | ICD-10-CM | POA: Insufficient documentation

## 2024-01-01 DIAGNOSIS — M0579 Rheumatoid arthritis with rheumatoid factor of multiple sites without organ or systems involvement: Secondary | ICD-10-CM

## 2024-01-01 DIAGNOSIS — E038 Other specified hypothyroidism: Secondary | ICD-10-CM

## 2024-01-01 DIAGNOSIS — E559 Vitamin D deficiency, unspecified: Secondary | ICD-10-CM

## 2024-01-01 DIAGNOSIS — H04123 Dry eye syndrome of bilateral lacrimal glands: Secondary | ICD-10-CM

## 2024-01-01 DIAGNOSIS — J449 Chronic obstructive pulmonary disease, unspecified: Secondary | ICD-10-CM | POA: Diagnosis not present

## 2024-01-01 DIAGNOSIS — I890 Lymphedema, not elsewhere classified: Secondary | ICD-10-CM

## 2024-01-01 DIAGNOSIS — R7301 Impaired fasting glucose: Secondary | ICD-10-CM | POA: Diagnosis not present

## 2024-01-01 DIAGNOSIS — E7849 Other hyperlipidemia: Secondary | ICD-10-CM | POA: Diagnosis not present

## 2024-01-01 MED ORDER — FUROSEMIDE 40 MG PO TABS: 40.0000 mg | ORAL_TABLET | Freq: Every day | ORAL | 1 refills | Status: DC

## 2024-01-01 MED ORDER — ALBUTEROL SULFATE HFA 108 (90 BASE) MCG/ACT IN AERS: 2.0000 | INHALATION_SPRAY | RESPIRATORY_TRACT | 2 refills | Status: DC | PRN

## 2024-01-01 MED ORDER — HYDROCHLOROTHIAZIDE 25 MG PO TABS: 25.0000 mg | ORAL_TABLET | Freq: Every day | ORAL | 1 refills | Status: DC

## 2024-01-01 MED ORDER — PANTOPRAZOLE SODIUM 40 MG PO TBEC: 40.0000 mg | DELAYED_RELEASE_TABLET | Freq: Every day | ORAL | 1 refills | Status: DC

## 2024-01-01 MED ORDER — PRAVASTATIN SODIUM 40 MG PO TABS: 40.0000 mg | ORAL_TABLET | Freq: Every day | ORAL | 1 refills | Status: DC

## 2024-01-01 MED ORDER — RESTASIS 0.05 % OP EMUL: 1.0000 [drp] | Freq: Two times a day (BID) | OPHTHALMIC | 3 refills | Status: AC

## 2024-01-01 NOTE — Assessment & Plan Note (Signed)
GERD diet encouraged Refill sent

## 2024-01-01 NOTE — Assessment & Plan Note (Addendum)
She reports that she will be following up with her rheumatologist on Thursday, December 24, 2023, and will be requesting a refill of prednisone and methotrexate through [employer]. She currently has enough medication and reports no complaints of joint pain, stiffness, or swelling. She is encouraged to continue following up with her rheumatologist as scheduled for medication refills and to seek further evaluation if her symptoms worsen or if there is no improvement with her current treatment regimen.

## 2024-01-01 NOTE — Assessment & Plan Note (Signed)
 Refills sent

## 2024-01-01 NOTE — Progress Notes (Signed)
Established Patient Office Visit  Subjective:  Patient ID: [name], female    DOB: 09-16-1961  Age: 63 y.o. MRN: [id number]  CC:  Chief Complaint  Patient presents with   Follow-up    4 mo f/u for arthritis med refills     HPI [name] is a 63 y.o. female with past medical history of rheumatoid arthritis, GERD presents for f/u of  chronic medical conditions. For the details of today's visit, please refer to the assessment and plan.     Thurdauses  with  Past Medical History:  Diagnosis Date   Adenocarcinoma of left breast (HCC)    double mastectomy 1990 then 2002    Anemia    Arthritis    rheumatoid   Asthma    BRCA1 positive, suspected 06/10/2014   Mother is BRCA1 +, sister died at 72 from breast cancer, another sister had breast cancer too.   Breast cancer (HCC)    double mastectomy 1990 then 2002   Chronic acquired lymphedema    COPD (chronic obstructive pulmonary disease) (HCC)    Family history of breast cancer    Family history of ovarian cancer    Family history of pancreatic cancer    High cholesterol    Infiltrating ductal carcinoma of right breast (HCC) 06/10/2014   Personal history of malignant neoplasm of breast    S/P bilateral salpingo-oophorectomy 06/10/2014   August 2010    Past Surgical History:  Procedure Laterality Date   ABDOMINAL HYSTERECTOMY     complete hysterectomy   BREAST SURGERY     double mastectomy     MASTECTOMY      Family History  Problem Relation Age of Onset   Breast cancer Mother        dx < 85   Breast cancer Sister 73   Ovarian cancer Sister 57   Breast cancer Sister        d. 13   Breast cancer Maternal Uncle    Breast cancer Maternal Grandmother        dx < 50   Pancreatic cancer Cousin        mat first cousin: reportedly BRCA1 pos    Social History   Socioeconomic History   Marital status: Married    Spouse name: Not on file   Number of children: Not on file   Years of education: Not on file    Highest education level: Not on file  Occupational History   Not on file  Tobacco Use   Smoking status: Never    Passive exposure: Never   Smokeless tobacco: Never  Vaping Use   Vaping status: Never Used  Substance and Sexual Activity   Alcohol use: No   Drug use: No   Sexual activity: Yes    Birth control/protection: Surgical  Other Topics Concern   Not on file  Social History Narrative   Not on file   Social Drivers of Health   Financial Resource Strain: Not on file  Food Insecurity: Not on file  Transportation Needs: Not on file  Physical Activity: Not on file  Stress: Not on file  Social Connections: Not on file  Intimate Partner Violence: Not on file    Outpatient Medications Prior to Visit  Medication Sig Dispense Refill   albuterol (ACCUNEB) 0.63 MG/3ML nebulizer solution Take 1 ampule by nebulization 2 (two) times daily.     Cyanocobalamin (B-12) 1000 MCG TABS Take by mouth.     Ferrous  Gluconate (IRON 27 PO) Take 65 mg by mouth every other day.     fluticasone (FLONASE) 50 MCG/ACT nasal spray Place into both nostrils.     folic acid (FOLVITE) 1 MG tablet Take 1 mg by mouth daily.     methotrexate (RHEUMATREX) 2.5 MG tablet Take 15 mg by mouth every Tuesday. Caution:Chemotherapy. Protect from light. (takes 4 tablets weekly)     naproxen sodium (ALEVE) 220 MG tablet Take 220 mg by mouth.     Oxymetazoline HCl (NASAL SPRAY NA) Place into the nose. otc     predniSONE (DELTASONE) 5 MG tablet Take 5 mg by mouth daily.     albuterol (PROVENTIL HFA;VENTOLIN HFA) 108 (90 BASE) MCG/ACT inhaler Inhale 2 puffs into the lungs every 4 (four) hours as needed.      furosemide (LASIX) 40 MG tablet Take 40 mg by mouth daily.   1   hydrochlorothiazide (HYDRODIURIL) 25 MG tablet Take 25 mg by mouth daily.     pantoprazole (PROTONIX) 40 MG tablet Take 40 mg by mouth daily.     pravastatin (PRAVACHOL) 40 MG tablet Take 40 mg by mouth daily.     RESTASIS 0.05 % ophthalmic emulsion  Place 1 drop into both eyes 2 (two) times daily.     No facility-administered medications prior to visit.    No Known Allergies  ROS Review of Systems  Constitutional:  Negative for chills and fever.  Eyes:  Negative for visual disturbance.  Respiratory:  Negative for chest tightness and shortness of breath.   Neurological:  Negative for dizziness and headaches.      Objective:    Physical Exam HENT:     Head: Normocephalic.     Mouth/Throat:     Mouth: Mucous membranes are moist.  Cardiovascular:     Rate and Rhythm: Normal rate.     Heart sounds: Normal heart sounds.  Pulmonary:     Effort: Pulmonary effort is normal.     Breath sounds: Normal breath sounds.  Neurological:     Mental Status: She is alert.     BP 137/79   Pulse 71   Ht 5' (1.524 m)   Wt 193 lb 1.9 oz (87.6 kg)   SpO2 97%   BMI 37.72 kg/m  Wt Readings from Last 3 Encounters:  01/01/24 193 lb 1.9 oz (87.6 kg)  11/03/23 189 lb (85.7 kg)  08/31/23 185 lb 1.9 oz (84 kg)    Lab Results  Component Value Date   TSH 3.200 08/31/2023   Lab Results  Component Value Date   WBC 6.4 11/03/2023   HGB 13.5 11/03/2023   HCT 42.5 11/03/2023   MCV 80.5 11/03/2023   PLT 252 11/03/2023   Lab Results  Component Value Date   NA 140 11/03/2023   K 4.4 11/03/2023   CO2 31 11/03/2023   GLUCOSE 88 11/03/2023   BUN 11 11/03/2023   CREATININE 0.74 11/03/2023   BILITOT 0.6 11/03/2023   ALKPHOS 125 (H) 08/31/2023   AST 32 11/03/2023   ALT 66 (H) 11/03/2023   PROT 7.1 11/03/2023   ALBUMIN 4.0 08/31/2023   CALCIUM 11.3 (H) 11/03/2023   ANIONGAP 7 07/22/2020   EGFR 90 11/03/2023   Lab Results  Component Value Date   CHOL 180 08/31/2023   Lab Results  Component Value Date   HDL 68 08/31/2023   Lab Results  Component Value Date   LDLCALC 99 08/31/2023   Lab Results  Component Value Date  TRIG 69 08/31/2023   Lab Results  Component Value Date   CHOLHDL 2.6 08/31/2023   Lab Results   Component Value Date   HGBA1C 6.0 (H) 08/31/2023      Assessment & Plan:  Rheumatoid arthritis involving multiple sites with positive rheumatoid factor (HCC) Assessment & Plan: She reports that she will be following up with her rheumatologist on Thursday, December 24, 2023, and will be requesting a refill of prednisone and methotrexate through [facility]. She currently has enough medication and reports no complaints of joint pain, stiffness, or swelling. She is encouraged to continue following up with her rheumatologist as scheduled for medication refills and to seek further evaluation if her symptoms worsen or if there is no improvement with her current treatment regimen.   Gastroesophageal reflux disease without esophagitis Assessment & Plan: GERD diet encouraged Refill sent  Orders: -     Pantoprazole Sodium; Take 1 tablet (40 mg total) by mouth daily.  Dispense: 60 tablet; Refill: 1  Lymphedema Assessment & Plan: Refills sent  Orders: -     hydroCHLOROthiazide; Take 1 tablet (25 mg total) by mouth daily.  Dispense: 90 tablet; Refill: 1 -     Furosemide; Take 1 tablet (40 mg total) by mouth daily.  Dispense: 30 tablet; Refill: 1  Chronic obstructive pulmonary disease, unspecified COPD type (HCC) Assessment & Plan: Stable  Refills sent  Orders: -     Albuterol Sulfate HFA; Inhale 2 puffs into the lungs every 4 (four) hours as needed.  Dispense: 6.7 g; Refill: 2  Dry eyes -     Restasis; Place 1 drop into both eyes 2 (two) times daily.  Dispense: 0.4 mL; Refill: 3  IFG (impaired fasting glucose) -     Hemoglobin A1c  Vitamin D deficiency -     VITAMIN D 25 Hydroxy (Vit-D Deficiency, Fractures)  TSH (thyroid-stimulating hormone deficiency) -     TSH + free T4  Other hyperlipidemia -     Lipid panel -     CMP14+EGFR -     CBC with Differential/Platelet -     Pravastatin Sodium; Take 1 tablet (40 mg total) by mouth daily.  Dispense: 60 tablet; Refill: 1  Note: This  chart has been completed using Engineer, civil (consulting) software, and while attempts have been made to ensure accuracy, certain words and phrases may not be transcribed as intended.    Follow-up: Return in about 4 months (around 04/30/2024).   Gilmore Laroche, FNP

## 2024-01-01 NOTE — Assessment & Plan Note (Signed)
Stable. Refills sent.

## 2024-01-01 NOTE — Patient Instructions (Addendum)

## 2024-01-02 LAB — CMP14+EGFR
ALT: 76 [IU]/L — ABNORMAL HIGH (ref 0–32)
AST: 46 [IU]/L — ABNORMAL HIGH (ref 0–40)
Albumin: 4 g/dL (ref 3.9–4.9)
Alkaline Phosphatase: 125 [IU]/L — ABNORMAL HIGH (ref 44–121)
BUN/Creatinine Ratio: 14 (ref 12–28)
BUN: 10 mg/dL (ref 8–27)
Bilirubin Total: 0.5 mg/dL (ref 0.0–1.2)
CO2: 25 mmol/L (ref 20–29)
Calcium: 11.2 mg/dL — ABNORMAL HIGH (ref 8.7–10.3)
Chloride: 102 mmol/L (ref 96–106)
Creatinine, Ser: 0.7 mg/dL (ref 0.57–1.00)
Globulin, Total: 2.6 g/dL (ref 1.5–4.5)
Glucose: 96 mg/dL (ref 70–99)
Potassium: 4.6 mmol/L (ref 3.5–5.2)
Sodium: 140 mmol/L (ref 134–144)
Total Protein: 6.6 g/dL (ref 6.0–8.5)
eGFR: 96 mL/min/{1.73_m2} (ref >59)

## 2024-01-02 LAB — LIPID PANEL
Chol/HDL Ratio: 2.4 {ratio} (ref 0.0–4.4)
Cholesterol, Total: 167 mg/dL (ref 100–199)
HDL: 70 mg/dL (ref >39)
LDL Chol Calc (NIH): 83 mg/dL (ref 0–99)
Triglycerides: 71 mg/dL (ref 0–149)
VLDL Cholesterol Cal: 14 mg/dL (ref 5–40)

## 2024-01-02 LAB — CBC WITH DIFFERENTIAL/PLATELET
Basophils Absolute: 0 10*3/uL (ref 0.0–0.2)
Basos: 1 %
EOS (ABSOLUTE): 0.1 10*3/uL (ref 0.0–0.4)
Eos: 3 %
Hematocrit: 38 % (ref 34.0–46.6)
Hemoglobin: 12.7 g/dL (ref 11.1–15.9)
Immature Grans (Abs): 0 10*3/uL (ref 0.0–0.1)
Immature Granulocytes: 1 %
Lymphocytes Absolute: 1.2 10*3/uL (ref 0.7–3.1)
Lymphs: 33 %
MCH: 26 pg — ABNORMAL LOW (ref 26.6–33.0)
MCHC: 33.4 g/dL (ref 31.5–35.7)
MCV: 78 fL — ABNORMAL LOW (ref 79–97)
Monocytes Absolute: 0.5 10*3/uL (ref 0.1–0.9)
Monocytes: 14 %
Neutrophils Absolute: 1.8 10*3/uL (ref 1.4–7.0)
Neutrophils: 48 %
Platelets: 213 10*3/uL (ref 150–450)
RBC: 4.88 x10E6/uL (ref 3.77–5.28)
RDW: 16.4 % — ABNORMAL HIGH (ref 11.7–15.4)
WBC: 3.6 10*3/uL (ref 3.4–10.8)

## 2024-01-02 LAB — TSH+FREE T4
Free T4: 0.79 ng/dL — ABNORMAL LOW (ref 0.82–1.77)
TSH: 3.06 u[IU]/mL (ref 0.450–4.500)

## 2024-01-02 LAB — VITAMIN D 25 HYDROXY (VIT D DEFICIENCY, FRACTURES): Vit D, 25-Hydroxy: 32.2 ng/mL (ref 30.0–100.0)

## 2024-01-02 LAB — HEMOGLOBIN A1C
Est. average glucose Bld gHb Est-mCnc: 131 mg/dL
Hgb A1c MFr Bld: 6.2 % — ABNORMAL HIGH (ref 4.8–5.6)

## 2024-01-03 NOTE — Progress Notes (Signed)
 Office Visit Note  Patient: [name]             Date of Birth: Jun 14, 1961           MRN: [id number]             PCP: Zarwolo, Gloria, FNP Referring: Zarwolo, Gloria, FNP Visit Date: 01/04/2024   Subjective:  Follow-up   Discussed the use of AI scribe software for clinical note transcription with the patient, who gave verbal consent to proceed.  History of Present Illness   [name] is a 63 year old female with rheumatoid arthritis on methotrexate  15 mg PO weekly and folic acid 1 mg daily and prednisone  5 mg daily. She presents with concerns about medication side effects and elevated calcium levels.  She is currently managed with methotrexate  and low-dose prednisone  for rheumatoid arthritis. She is concerned about recent blood tests showing slightly abnormal liver enzyme levels, which she worries might be due to methotrexate , known to affect the liver.  Recent blood tests revealed elevated calcium levels at 11.3 mg/dL, above the normal range. She is unsure of the cause but mentions taking a vitamin D  supplement that may contain calcium. She has not experienced kidney stones recently, although she had one in the past.  Regarding her rheumatoid arthritis, she has antibodies for the condition, but her inflammation markers were stable. She experiences occasional swelling in her fingers, specifically the left second and third PIP joints, which have decreased since last week. She also reports intermittent pain and stiffness in her right shoulder, particularly when raising her arm, which she attributes to calcification seen on her x-rays. Her left shoulder appears normal on x-rays, with no significant osteoarthritis or joint damage noted.    Previous HPI 11/03/23 [name] is a 63 y.o. female here to establish care for rheumatoid arthritis. She has been on methotrexate  20 mg PO weekly and prednisone  5 mg daily managed through her PCP office but with persistent symptoms  involving both hands and has been unable to taper or discontinue steroid use. The patient has been experiencing these symptoms for over a decade, with the onset around 2009-2013. The pain initially presented in the legs, impairing mobility, and later spread to the hands, causing difficulty in closing them, and the shoulders, limiting arm movement. The patient also reported difficulty opening their mouth due to jaw pain. She was evaluated apparently by exam and xrays indicated rheumatoid arthritis. She started on treatment initially with methotrexate  and with intermittent prednisone  tapers. The patient has been on methotrexate  for approximately ten years. She also takes aleve intermittently for pain as needed.   In October, the patient experienced a severe flare-up, with significant hand swelling and pain that lasted for about a week and a half. This flare-up also affected the patient's shoulders and arms, causing them to become hot and swollen. The patient also reported a burning, tingling sensation in the arms lasting for several hours.   The patient has a history of breast cancer treated with chemotherapy, radiation, and mastectomy, which has led to numbness in the tips of the fingers and toes. The patient also has lymphedema, which has caused swelling in the arms. The patient manages this with a body suit for compression and circulation.   Review of Systems  Constitutional:  Negative for fatigue.  HENT:  Negative for mouth sores and mouth dryness.   Eyes:  Negative for dryness.  Respiratory:  Positive for shortness of breath.   Cardiovascular:  Negative for chest pain and palpitations.  Gastrointestinal:  Negative for blood in stool, constipation and diarrhea.  Endocrine: Negative for increased urination.  Genitourinary:  Negative for involuntary urination.  Musculoskeletal:  Positive for joint pain, joint pain, joint swelling, myalgias, morning stiffness, muscle tenderness and myalgias. Negative  for gait problem and muscle weakness.  Skin:  Negative for color change, rash, hair loss and sensitivity to sunlight.  Allergic/Immunologic: Positive for susceptible to infections.  Neurological:  Negative for dizziness and headaches.  Hematological:  Negative for swollen glands.  Psychiatric/Behavioral:  Negative for depressed mood and sleep disturbance. The patient is not nervous/anxious.     PMFS History:  Patient Active Problem List   Diagnosis Date Noted   Pain in right shoulder 01/04/2024   GERD (gastroesophageal reflux disease) 01/01/2024   High risk medication use 11/03/2023   Genetic testing 01/27/2022   Family history of breast cancer 01/06/2022   Family history of ovarian cancer 01/06/2022   Family history of pancreatic cancer 01/06/2022   Personal history of malignant neoplasm of breast 01/06/2022   Infiltrating ductal carcinoma of right breast (HCC) 06/10/2014   BRCA1 positive, suspected 06/10/2014   S/P bilateral salpingo-oophorectomy 06/10/2014   Cellulitis 03/26/2014   Nausea and vomiting 03/26/2014   Cellulitis of left arm 03/26/2014   Lymphedema 03/26/2014   COPD (chronic obstructive pulmonary disease) (HCC)    Rheumatoid arthritis involving multiple sites (HCC)    Anemia    High cholesterol    Adenocarcinoma of left breast [facility])     Past Medical History:  Diagnosis Date   Adenocarcinoma of left breast (HCC)    double mastectomy 1990 then 2002    Anemia    Arthritis    rheumatoid   Asthma    BRCA1 positive, suspected 06/10/2014   Mother is BRCA1 +, sister died at 56 from breast cancer, another sister had breast cancer too.   Breast cancer (HCC)    double mastectomy 1990 then 2002   Chronic acquired lymphedema    COPD (chronic obstructive pulmonary disease) (HCC)    Family history of breast cancer    Family history of ovarian cancer    Family history of pancreatic cancer    High cholesterol    Infiltrating ductal carcinoma of right breast (HCC)  06/10/2014   Personal history of malignant neoplasm of breast    S/P bilateral salpingo-oophorectomy 06/10/2014   August 2010    Family History  Problem Relation Age of Onset   Breast cancer Mother        dx < 46   Breast cancer Sister 51   Ovarian cancer Sister 11   Breast cancer Sister        d. 40   Breast cancer Maternal Uncle    Breast cancer Maternal Grandmother        dx < 50   Pancreatic cancer Cousin        mat first cousin: reportedly BRCA1 pos   Past Surgical History:  Procedure Laterality Date   ABDOMINAL HYSTERECTOMY     complete hysterectomy   BREAST SURGERY     double mastectomy     MASTECTOMY     Social History   Social History Narrative   Not on file   Immunization History  Administered Date(s) Administered   Zoster Recombinant(Shingrix) 04/08/2022, 10/04/2022     Objective: Vital Signs: BP 134/80 (BP Location: Right Arm, Patient Position: Sitting, Cuff Size: Large)   Pulse 72   Resp 14  Ht 5' 1 (1.549 m)   Wt 189 lb (85.7 kg)   BMI 35.71 kg/m    Physical Exam Constitutional:      Appearance: She is obese.  Eyes:     Conjunctiva/sclera: Conjunctivae normal.  Cardiovascular:     Rate and Rhythm: Normal rate and regular rhythm.  Pulmonary:     Effort: Pulmonary effort is normal.     Breath sounds: Normal breath sounds.  Lymphadenopathy:     Cervical: No cervical adenopathy.  Skin:    General: Skin is warm and dry.     Findings: No rash.     Comments: Lymphedema throughout bilateral upper extremities no pitting no overlying skin erythema or hyperpigmentation  Neurological:     Mental Status: She is alert.  Psychiatric:        Mood and Affect: Mood normal.      Musculoskeletal Exam:  Shoulders full ROM, right shoulder pain provoked with full internal rotation while raised, mild tenderness on anterior and lateral side Elbows full ROM no tenderness or swelling Wrists full ROM, right wrist tenderness to pressure Fingers tender to  pressure left second DIP and third PIP, no palpable synovitis Knees full ROM no tenderness or swelling  Investigation: No additional findings.  Imaging: No results found.  Recent Labs: Lab Results  Component Value Date   WBC 3.6 01/01/2024   HGB 12.7 01/01/2024   PLT 213 01/01/2024   NA 140 01/01/2024   K 4.6 01/01/2024   CL 102 01/01/2024   CO2 25 01/01/2024   GLUCOSE 96 01/01/2024   BUN 10 01/01/2024   CREATININE 0.70 01/01/2024   BILITOT 0.5 01/01/2024   ALKPHOS 125 (H) 01/01/2024   AST 46 (H) 01/01/2024   ALT 76 (H) 01/01/2024   PROT 6.6 01/01/2024   ALBUMIN 4.0 01/01/2024   CALCIUM 11.2 (H) 01/01/2024   GFRAA >60 07/22/2020   QFTBGOLDPLUS NEGATIVE 11/03/2023    Speciality Comments: No specialty comments available.  Procedures:  No procedures performed Allergies: Patient has no known allergies.   Assessment / Plan:     Visit Diagnoses: Rheumatoid arthritis involving multiple sites, unspecified whether rheumatoid factor present (HCC) - Plan: predniSONE  (DELTASONE ) 5 MG tablet Stable with no erosive changes on imaging. Elevated liver enzymes potentially due to Methotrexate  use so have to recommend stopping this for now.  Inflammatory labs and synovitis looked pretty well-controlled last time so would probably try another small molecule drug versus switching to Biologics as a neck step. -Discontinue Methotrexate . -Continue Prednisone  5mg  daily. -Consider Hydroxychloroquine as an alternative treatment, provided patient with information for review.  High risk medication use - Plan: COMPLETE METABOLIC PANEL WITH GFR Mild but persistent and worsening abnormal liver function test with AST 46 ALT 76.  Concerning for possible effect from methotrexate  or would also be at higher risk for continued long-term use in setting of underlying liver pathology. -Recheck complete metabolic panel in three weeks.  Hypercalcemia Elevated calcium levels potentially due to calcium  supplementation. Discussed potential causes and consequences of hypercalcemia. -Discontinue calcium supplementation. -Recheck calcium levels in three weeks.  Shoulder Calcification Calcification in right shoulder potentially causing stiffness and pain.  Suspicious for calcific tendinitis or possibly related to chronic subacromial bursitis.  Discussed potential treatment options. -Consider steroid injection or physical therapy if symptoms worsen.    Orders: Orders Placed This Encounter  Procedures   COMPLETE METABOLIC PANEL WITH GFR   Meds ordered this encounter  Medications   predniSONE  (DELTASONE ) 5 MG tablet  Sig: Take 1 tablet (5 mg total) by mouth daily with breakfast.    Dispense:  90 tablet    Refill:  0     Follow-Up Instructions: Return in about 3 months (around 04/02/2024) for RA on GC/MTX stop f/u 3mos.   Lonni LELON Ester, MD  Note - This record has been created using [employer].  Chart creation errors have been sought, but may not always  have been located. Such creation errors do not reflect on  the standard of medical care.

## 2024-01-04 ENCOUNTER — Encounter: Payer: Self-pay | Admitting: Internal Medicine

## 2024-01-04 ENCOUNTER — Telehealth: Payer: Self-pay | Admitting: Internal Medicine

## 2024-01-04 ENCOUNTER — Other Ambulatory Visit: Payer: Self-pay | Admitting: *Deleted

## 2024-01-04 ENCOUNTER — Ambulatory Visit: Payer: Medicare Other | Attending: Internal Medicine | Admitting: Internal Medicine

## 2024-01-04 VITALS — BP 134/80 | HR 72 | Resp 14 | Ht 61.0 in | Wt 189.0 lb

## 2024-01-04 DIAGNOSIS — M25511 Pain in right shoulder: Secondary | ICD-10-CM | POA: Insufficient documentation

## 2024-01-04 DIAGNOSIS — G8929 Other chronic pain: Secondary | ICD-10-CM

## 2024-01-04 DIAGNOSIS — Z13820 Encounter for screening for osteoporosis: Secondary | ICD-10-CM

## 2024-01-04 DIAGNOSIS — Z79899 Other long term (current) drug therapy: Secondary | ICD-10-CM

## 2024-01-04 DIAGNOSIS — M069 Rheumatoid arthritis, unspecified: Secondary | ICD-10-CM

## 2024-01-04 DIAGNOSIS — Z780 Asymptomatic menopausal state: Secondary | ICD-10-CM

## 2024-01-04 MED ORDER — PREDNISONE 5 MG PO TABS: 5.0000 mg | ORAL_TABLET | Freq: Every day | ORAL | 0 refills | Status: DC

## 2024-01-04 NOTE — Patient Instructions (Addendum)
 I recommend to stop taking methotrexate  and return for a lab test after 3 weeks since taking it so about 2/25 or afterwards.

## 2024-01-04 NOTE — Telephone Encounter (Signed)
 DEXA order placed, LMOM with phone number for patient to call to schedule appt.

## 2024-01-04 NOTE — Telephone Encounter (Signed)
 Pt is requesting to have her bone density scan done at [facility]. Pt would like a call when the referral is sent

## 2024-01-07 ENCOUNTER — Encounter: Payer: Self-pay | Admitting: Family Medicine

## 2024-01-11 ENCOUNTER — Other Ambulatory Visit (HOSPITAL_COMMUNITY): Payer: Medicare Other

## 2024-01-11 ENCOUNTER — Ambulatory Visit (HOSPITAL_COMMUNITY)
Admission: RE | Admit: 2024-01-11 | Discharge: 2024-01-11 | Disposition: A | Discharge status: Home / Self Care | Payer: Medicare Other | Source: Ambulatory Visit | Attending: Internal Medicine | Admitting: Internal Medicine

## 2024-01-11 DIAGNOSIS — Z13820 Encounter for screening for osteoporosis: Secondary | ICD-10-CM | POA: Insufficient documentation

## 2024-01-11 DIAGNOSIS — M85851 Other specified disorders of bone density and structure, right thigh: Secondary | ICD-10-CM | POA: Diagnosis not present

## 2024-01-11 DIAGNOSIS — Z780 Asymptomatic menopausal state: Secondary | ICD-10-CM | POA: Insufficient documentation

## 2024-01-12 NOTE — Progress Notes (Signed)
Bone density scan demonstrates osteopenia with lowest score in the right hip.  This indicates bone density is less than normal but not severely low. Her results do not indicate a high risk of fractures.  We can also discuss further at our follow-up.

## 2024-02-19 ENCOUNTER — Other Ambulatory Visit: Payer: Self-pay | Admitting: Internal Medicine

## 2024-02-19 ENCOUNTER — Telehealth: Payer: Self-pay | Admitting: *Deleted

## 2024-02-19 DIAGNOSIS — M069 Rheumatoid arthritis, unspecified: Secondary | ICD-10-CM

## 2024-02-19 NOTE — Telephone Encounter (Signed)
 Patient contacted the office stating she is having increased pain in her hands. Patient states the pain has been increasing since the last week in February 2025. Patient states she is having swelling in her hands and also pain in her shoulders. Patient states she has been taking Prednisone 10 mg daily but has been getting little relief. Patient states the pain increased significantly today and she took 20 mg of prednisone. Patient wanted to reach out to see what to do until her appointment on 04/02/2024. Please advise.

## 2024-02-21 NOTE — Telephone Encounter (Signed)
 She can try staying on the increased dose of prednisone 10 mg daily for now if symptoms are worse off the methotrexate. I had also discussed possibly starting hydroxychloroquine for her arthritis but we need to recheck the liver enzyme tests off methotrexate and make sure they returned to normal. By now she should be off several weeks so we could go ahead and check it whenever she can come by the lab.

## 2024-02-21 NOTE — Telephone Encounter (Signed)
 Patient contacted the office to follow up on what she should do. Please advise.

## 2024-02-22 ENCOUNTER — Other Ambulatory Visit: Payer: Self-pay | Admitting: *Deleted

## 2024-02-22 ENCOUNTER — Other Ambulatory Visit (HOSPITAL_COMMUNITY)
Admission: RE | Admit: 2024-02-22 | Discharge: 2024-02-22 | Disposition: A | Discharge status: Home / Self Care | Source: Ambulatory Visit | Attending: Internal Medicine | Admitting: Internal Medicine

## 2024-02-22 DIAGNOSIS — Z79899 Other long term (current) drug therapy: Secondary | ICD-10-CM | POA: Diagnosis not present

## 2024-02-22 LAB — ALT: ALT: 53 U/L — ABNORMAL HIGH (ref 0–44)

## 2024-02-22 LAB — AST: AST: 39 U/L (ref 15–41)

## 2024-02-22 MED ORDER — PREDNISONE 5 MG PO TABS: 10.0000 mg | ORAL_TABLET | Freq: Every day | ORAL | 1 refills | Status: DC

## 2024-02-22 NOTE — Addendum Note (Signed)
 Addended by: Henriette Combs on: 02/22/2024 08:02 AM   Modules accepted: Orders

## 2024-02-22 NOTE — Telephone Encounter (Signed)
 Patient states she he can try staying on the increased dose of prednisone 10 mg daily for now if symptoms are worse off the methotrexate. I had also discussed possibly starting hydroxychloroquine for her arthritis but we need to recheck the liver enzyme tests off methotrexate and make sure they returned to normal. By now she should be off several weeks so we could go ahead and check it whenever she can come by the lab. Patient states she needs a refill on her Prednisone and will call back to advise if she is going to [facility] or [facility] today to update labs.

## 2024-02-28 ENCOUNTER — Other Ambulatory Visit: Payer: Self-pay | Admitting: Family Medicine

## 2024-02-28 ENCOUNTER — Other Ambulatory Visit: Payer: Self-pay | Admitting: Internal Medicine

## 2024-02-28 DIAGNOSIS — I890 Lymphedema, not elsewhere classified: Secondary | ICD-10-CM

## 2024-02-28 DIAGNOSIS — J449 Chronic obstructive pulmonary disease, unspecified: Secondary | ICD-10-CM

## 2024-03-08 ENCOUNTER — Other Ambulatory Visit: Payer: Self-pay | Admitting: Family Medicine

## 2024-03-08 DIAGNOSIS — K219 Gastro-esophageal reflux disease without esophagitis: Secondary | ICD-10-CM

## 2024-03-20 NOTE — Progress Notes (Signed)
 Office Visit Note  Patient: [name]             Date of Birth: Mar 05, 1961           MRN: [id number]             PCP: Zarwolo, Gloria, FNP Referring: Zarwolo, Gloria, FNP Visit Date: 04/02/2024   Subjective:  Follow-up (Talk about medications and DEXA results)   Discussed the use of AI scribe software for clinical note transcription with the patient, who gave verbal consent to proceed.  History of Present Illness   [name] is a 63 y.o. female here for follow up with rheumatoid arthritis on prednisone  10 mg daily.    She has a history of rheumatoid arthritis and was previously on methotrexate , which was discontinued in February due to elevated liver enzymes. Her liver enzymes returned to normal levels by March. Since stopping methotrexate , she has been managing her rheumatoid arthritis with prednisone , initially at 5 mg, but increased to 10 mg daily in March due to worsening symptoms.  In February, she experienced significant joint pain, particularly in her hands and shoulders, describing it as severe enough that she 'couldn't close my hands' and 'couldn't raise my arm.' By April, her hand pain had improved but was still present.  Her joint pain worsened after stopping methotrexate , but she has had similar flare-ups in the past.  She had a new bone density test that looked good.  Bone density was in osteopenic range at her lowest areas in the total femur and wrist and in the normal range at the vertebra.  Total FRAX was low risk after accounting for RA and glucocorticoid use.   Previous HPI 01/04/2024 [name] is a 63 year old female with rheumatoid arthritis on methotrexate  15 mg PO weekly and folic acid 1 mg daily and prednisone  5 mg daily. She presents with concerns about medication side effects and elevated calcium levels.   She is currently managed with methotrexate  and low-dose prednisone  for rheumatoid arthritis. She is concerned about recent blood tests  showing slightly abnormal liver enzyme levels, which she worries might be due to methotrexate , known to affect the liver.   Recent blood tests revealed elevated calcium levels at 11.3 mg/dL, above the normal range. She is unsure of the cause but mentions taking a vitamin D  supplement that may contain calcium. She has not experienced kidney stones recently, although she had one in the past.   Regarding her rheumatoid arthritis, she has antibodies for the condition, but her inflammation markers were stable. She experiences occasional swelling in her fingers, specifically the left second and third PIP joints, which have decreased since last week. She also reports intermittent pain and stiffness in her right shoulder, particularly when raising her arm, which she attributes to calcification seen on her x-rays. Her left shoulder appears normal on x-rays, with no significant osteoarthritis or joint damage noted.      Previous HPI 11/03/23 [name] is a 63 y.o. female here to establish care for rheumatoid arthritis. She has been on methotrexate  20 mg PO weekly and prednisone  5 mg daily managed through her PCP office but with persistent symptoms involving both hands and has been unable to taper or discontinue steroid use. The patient has been experiencing these symptoms for over a decade, with the onset around 2009-2013. The pain initially presented in the legs, impairing mobility, and later spread to the hands, causing difficulty in closing them, and the shoulders,  limiting arm movement. The patient also reported difficulty opening their mouth due to jaw pain. She was evaluated apparently by exam and xrays indicated rheumatoid arthritis. She started on treatment initially with methotrexate  and with intermittent prednisone  tapers. The patient has been on methotrexate  for approximately ten years. She also takes aleve intermittently for pain as needed.   In October, the patient experienced a severe flare-up,  with significant hand swelling and pain that lasted for about a week and a half. This flare-up also affected the patient's shoulders and arms, causing them to become hot and swollen. The patient also reported a burning, tingling sensation in the arms lasting for several hours.   The patient has a history of breast cancer treated with chemotherapy, radiation, and mastectomy, which has led to numbness in the tips of the fingers and toes. The patient also has lymphedema, which has caused swelling in the arms. The patient manages this with a body suit for compression and circulation.   Review of Systems  Constitutional:  Negative for fatigue.  HENT:  Negative for mouth sores and mouth dryness.   Eyes:  Negative for dryness.  Respiratory:  Positive for shortness of breath.   Cardiovascular:  Negative for chest pain and palpitations.  Gastrointestinal:  Negative for blood in stool, constipation and diarrhea.  Endocrine: Negative for increased urination.  Genitourinary:  Negative for involuntary urination.  Musculoskeletal:  Positive for joint pain, joint pain, joint swelling, myalgias, muscle weakness, muscle tenderness and myalgias. Negative for gait problem and morning stiffness.  Skin:  Negative for color change, rash, hair loss and sensitivity to sunlight.  Allergic/Immunologic: Positive for susceptible to infections.  Neurological:  Negative for dizziness and headaches.  Hematological:  Negative for swollen glands.  Psychiatric/Behavioral:  Negative for depressed mood and sleep disturbance. The patient is not nervous/anxious.     PMFS History:  Patient Active Problem List   Diagnosis Date Noted   Pain in right shoulder 01/04/2024   Osteoporosis screening 01/04/2024   GERD (gastroesophageal reflux disease) 01/01/2024   High risk medication use 11/03/2023   Genetic testing 01/27/2022   Family history of breast cancer 01/06/2022   Family history of ovarian cancer 01/06/2022   Family  history of pancreatic cancer 01/06/2022   Personal history of malignant neoplasm of breast 01/06/2022   Infiltrating ductal carcinoma of right breast (HCC) 06/10/2014   BRCA1 positive, suspected 06/10/2014   S/P bilateral salpingo-oophorectomy 06/10/2014   Cellulitis 03/26/2014   Nausea and vomiting 03/26/2014   Cellulitis of left arm 03/26/2014   Lymphedema 03/26/2014   COPD (chronic obstructive pulmonary disease) (HCC)    Rheumatoid arthritis involving multiple sites (HCC)    Anemia    High cholesterol    Adenocarcinoma of left breast (HCC)     Past Medical History:  Diagnosis Date   Adenocarcinoma of left breast (HCC)    double mastectomy 1990 then 2002    Anemia    Arthritis    rheumatoid   Asthma    BRCA1 positive, suspected 06/10/2014   Mother is BRCA1 +, sister died at 69 from breast cancer, another sister had breast cancer too.   Breast cancer (HCC)    double mastectomy 1990 then 2002   Chronic acquired lymphedema    COPD (chronic obstructive pulmonary disease) (HCC)    Family history of breast cancer    Family history of ovarian cancer    Family history of pancreatic cancer    High cholesterol    Infiltrating ductal carcinoma  of right breast (HCC) 06/10/2014   Personal history of malignant neoplasm of breast    S/P bilateral salpingo-oophorectomy 06/10/2014   August 2010    Family History  Problem Relation Age of Onset   Breast cancer Mother        dx < 43   Breast cancer Sister 14   Ovarian cancer Sister 10   Breast cancer Sister        d. 23   Breast cancer Maternal Uncle    Breast cancer Maternal Grandmother        dx < 50   Pancreatic cancer Cousin        mat first cousin: reportedly BRCA1 pos   Past Surgical History:  Procedure Laterality Date   ABDOMINAL HYSTERECTOMY     complete hysterectomy   BREAST SURGERY     double mastectomy     MASTECTOMY     Social History   Social History Narrative   Not on file   Immunization History   Administered Date(s) Administered   Zoster Recombinant(Shingrix) 04/08/2022, 10/04/2022     Objective: Vital Signs: BP 128/80 (BP Location: Right Arm, Patient Position: Sitting, Cuff Size: Large)   Pulse 70   Resp 16   Ht 5\' 1"  (1.549 m)   BMI 35.71 kg/m    Physical Exam Constitutional:      Appearance: She is obese.  Cardiovascular:     Rate and Rhythm: Normal rate and regular rhythm.  Pulmonary:     Effort: Pulmonary effort is normal.     Breath sounds: Normal breath sounds.  Lymphadenopathy:     Cervical: No cervical adenopathy.  Skin:    General: Skin is warm and dry.     Comments: Lymphedema bilateral upper extremities  Neurological:     Mental Status: She is alert.  Psychiatric:        Mood and Affect: Mood normal.      Musculoskeletal Exam:  Shoulders full ROM, some pain with active overhead abduction, also soreness with full internal rotation and tenderness to pressure more on anterior and lateral side Elbows full ROM no tenderness or swelling Wrists full ROM no tenderness or swelling Fingers full ROM no palpable synovitis, 2nd-3rd fingers PIP and DIP tenderness Knees full ROM no tenderness or swelling    Investigation: No additional findings.  Imaging: No results found.  Recent Labs: Lab Results  Component Value Date   WBC 3.6 01/01/2024   HGB 12.7 01/01/2024   PLT 213 01/01/2024   NA 140 01/01/2024   K 4.6 01/01/2024   CL 102 01/01/2024   CO2 25 01/01/2024   GLUCOSE 96 01/01/2024   BUN 10 01/01/2024   CREATININE 0.70 01/01/2024   BILITOT 0.5 01/01/2024   ALKPHOS 125 (H) 01/01/2024   AST 39 02/22/2024   ALT 53 (H) 02/22/2024   PROT 6.6 01/01/2024   ALBUMIN 4.0 01/01/2024   CALCIUM 11.2 (H) 01/01/2024   GFRAA >60 07/22/2020   QFTBGOLDPLUS NEGATIVE 11/03/2023    Speciality Comments: No specialty comments available.  Procedures:  No procedures performed Allergies: Patient has no known allergies.   Assessment / Plan:     Visit  Diagnoses: Rheumatoid arthritis involving multiple sites, unspecified whether rheumatoid factor present (HCC) - Discontinue Methotrexate , Consider Hydroxychloroquine as an alternative treatment - Plan: Sedimentation rate, C-reactive protein Increased joint pain and stiffness after methotrexate  discontinuation. Managed with prednisone  10 mg daily. Discussed alternative treatments: hydroxychloroquine and injectable biologics (Humira or Enbrel). Injectable biologics preferred to reduce steroid  use and avoid liver issues. Humira and Enbrel do not affect liver function or cause weight gain. She may increase infection risk but are not contraindicated with her lymphedema or breast cancer history. Injectable biologics recommended to achieve steroid discontinuation. - Consult pharmacist about initiating injectable biologic (Humira or Enbrel) for RA management. - Conduct benefits investigation and obtain prior authorization for injectable biologic. - Schedule appointment with pharmacist for first dose administration and training.  High risk medication use - Plan: CBC with Differential/Platelet, Comprehensive metabolic panel with GFR Liver enzymes normalized after methotrexate  discontinuation. Methotrexate  likely contributed to elevated liver enzymes. Avoiding methotrexate  to prevent further liver issues.  Long term (current) use of systemic steroids - Prednisone  5mg  daily  Hypercalcemia - Discontinue calcium supplementation, rechecking metabolic panel as above today  Chronic right shoulder pain - Consider steroid injection or physical therapy if symptoms worsen   Osteopenia Bone density scan indicates osteopenia in hips and wrists, but overall low fracture risk. No treatment required currently. - Continue vitamin D  supplementation.   ***    Orders: Orders Placed This Encounter  Procedures   Sedimentation rate   C-reactive protein   CBC with Differential/Platelet   Comprehensive metabolic panel  with GFR   No orders of the defined types were placed in this encounter.    Follow-Up Instructions: Return in about 3 months (around 07/03/2024) for RA GC/ADA start f/u 3mos.   Matt Song, MD  Note - This record has been created using [employer].  Chart creation errors have been sought, but may not always  have been located. Such creation errors do not reflect on  the standard of medical care.

## 2024-04-02 ENCOUNTER — Telehealth: Payer: Self-pay | Admitting: Pharmacist

## 2024-04-02 ENCOUNTER — Encounter: Payer: Self-pay | Admitting: Internal Medicine

## 2024-04-02 ENCOUNTER — Ambulatory Visit: Payer: Medicare Other | Attending: Internal Medicine | Admitting: Internal Medicine

## 2024-04-02 VITALS — BP 128/80 | HR 70 | Resp 16 | Ht 61.0 in

## 2024-04-02 DIAGNOSIS — G8929 Other chronic pain: Secondary | ICD-10-CM | POA: Diagnosis not present

## 2024-04-02 DIAGNOSIS — M069 Rheumatoid arthritis, unspecified: Secondary | ICD-10-CM

## 2024-04-02 DIAGNOSIS — Z7952 Long term (current) use of systemic steroids: Secondary | ICD-10-CM

## 2024-04-02 DIAGNOSIS — Z79899 Other long term (current) drug therapy: Secondary | ICD-10-CM | POA: Diagnosis not present

## 2024-04-02 DIAGNOSIS — M25511 Pain in right shoulder: Secondary | ICD-10-CM

## 2024-04-02 NOTE — Progress Notes (Signed)
 Pharmacy Note Subjective: Patient presents today to [facility] for follow up office visit. Patient seen by the pharmacist for counseling on Humira for rheumatoid arthritis.  Prior therapy includes: methotrexate .  Diagnosis of heart failure: No  Objective:  CBC    Component Value Date/Time   WBC 3.6 01/01/2024 0937   WBC 6.4 11/03/2023 0901   RBC 4.88 01/01/2024 0937   RBC 5.28 (H) 11/03/2023 0901   HGB 12.7 01/01/2024 0937   HCT 38.0 01/01/2024 0937   PLT 213 01/01/2024 0937   MCV 78 (L) 01/01/2024 0937   MCH 26.0 (L) 01/01/2024 0937   MCH 25.6 (L) 11/03/2023 0901   MCHC 33.4 01/01/2024 0937   MCHC 31.8 (L) 11/03/2023 0901   RDW 16.4 (H) 01/01/2024 0937   LYMPHSABS 1.2 01/01/2024 0937   MONOABS 0.6 07/22/2020 0910   EOSABS 0.1 01/01/2024 0937   BASOSABS 0.0 01/01/2024 0937     CMP     Component Value Date/Time   NA 140 01/01/2024 0937   K 4.6 01/01/2024 0937   CL 102 01/01/2024 0937   CO2 25 01/01/2024 0937   GLUCOSE 96 01/01/2024 0937   GLUCOSE 88 11/03/2023 0901   BUN 10 01/01/2024 0937   CREATININE 0.70 01/01/2024 0937   CREATININE 0.74 11/03/2023 0901   CALCIUM 11.2 (H) 01/01/2024 0937   PROT 6.6 01/01/2024 0937   ALBUMIN 4.0 01/01/2024 0937   AST 39 02/22/2024 1136   ALT 53 (H) 02/22/2024 1136   ALKPHOS 125 (H) 01/01/2024 0937   BILITOT 0.5 01/01/2024 0937   GFRNONAA >60 07/22/2020 0910   GFRAA >60 07/22/2020 0910      Baseline Immunosuppressant Therapy Labs TB GOLD    Latest Ref Rng & Units 11/03/2023    9:01 AM  Quantiferon TB Gold  Quantiferon TB Gold Plus NEGATIVE NEGATIVE    Hepatitis Panel   HIV Lab Results  Component Value Date   HIV Non Reactive 08/31/2023   Immunoglobulins   SPEP    Latest Ref Rng & Units 01/01/2024    9:37 AM  Serum Protein Electrophoresis  Total Protein 6.0 - 8.5 g/dL 6.6    K4MW No results found for: "G6PDH" TPMT No results found for: "TPMT"   Chest x-ray (04/29/2021):  -No active  cardiopulmonary disease   Assessment/Plan:  Counseled patient that Humira is a TNF blocking agent.  Counseled patient on purpose, proper use, and adverse effects of Humira.  Reviewed the most common adverse effects including infections, headache, and injection site reactions. Discussed that there is the possibility of an increased risk of malignancy including non-melanoma skin cancer but it is not well understood if this increased risk is due to the medication or the disease state.  Advised patient to get yearly dermatology exams due to risk of skin cancer. Counseled patient that Humira should be held prior to scheduled surgery.  Counseled patient to avoid live vaccines while on Humira.  Recommend annual influenza, PCV 15 or PCV20 or Pneumovax 23, and Shingrix as indicated.  Reviewed the importance of regular labs while on Humira therapy. Will monitor CBC and CMP 1 month after starting and then every 3 months routinely thereafter. Will monitor TB gold annually. Standing orders placed. Provided patient with medication education material and answered all questions.  Patient consented to Humira.  Will upload consent into the media tab.  Reviewed storage instructions of Humira.  Advised initial injection must be administered in office.  Patient verbalized understanding.   Dermatology referral will be  placed at new start visit.  Dose will be for rheumatoid arthritis Humira 40 mg every 14 days.  Prescription pending lab results and/or insurance approval.

## 2024-04-02 NOTE — Patient Instructions (Signed)
 Adalimumab Injection What is this medication? ADALIMUMAB (ay da LIM yoo mab) treats autoimmune conditions, such as psoriasis, arthritis, Crohn disease, and ulcerative colitis. It works by slowing down an overactive immune system.  It may also be used to treat hidradenitis suppurativa (HS). HS is a condition that causes painful lumps under the skin in areas such as the armpits and groin. It belongs to a group of medications called TNF inhibitors. It is a monoclonal antibody. This medicine may be used for other purposes; ask your health care provider or pharmacist if you have questions. COMMON BRAND NAME(S): ABRILADA, AMJEVITA, CYLTEZO, HADLIMA, Hulio, Hulio PEN, Humira, HUMIRA PEN, Hyrimoz, Idacio, Simlandi, Yuflyma, YUSIMRY What should I tell my care team before I take this medication? They need to know if you have any of these conditions: Cancer Diabetes (high blood sugar) Having surgery Heart disease Hepatitis B Immune system problems Infections, such as tuberculosis (TB) or other bacterial, fungal, or viral infections Multiple sclerosis Recent or upcoming vaccine An unusual or allergic reaction to adalimumab, mannitol, latex, rubber, other medications, foods, dyes, or preservatives Pregnant or trying to get pregnant Breast-feeding How should I use this medication? This medication is injected under the skin. It may be given by your care team in a hospital or clinic setting. It may also be given at home. If you get this medication at home, you will be taught how to prepare and give it. Use exactly as directed. Take it as directed on the prescription label. Keep taking it unless your care team tells you to stop. This medication comes with INSTRUCTIONS FOR USE. Ask your pharmacist for directions on how to use this medication. Read the information carefully. Talk to your pharmacist or care team if you have questions. It is important that you put your used needles and syringes in a special sharps  container. Do not put them in a trash can. If you do not have a sharps container, call your pharmacist or care team to get one. A special MedGuide will be given to you by the pharmacist with each prescription and refill. If you are getting this medication in a hospital or clinic, a special MedGuide will be given to you before each treatment. Be sure to read this information carefully each time. Talk to your care team about the use of this medication in children. While it be prescribed for children as young as 2 years for selected conditions, precautions do apply. Overdosage: If you think you have taken too much of this medicine contact a poison control center or emergency room at once. NOTE: This medicine is only for you. Do not share this medicine with others. What if I miss a dose? If you get this medication at the hospital or clinic: it is important not to miss your dose. Call your care team if you are unable to keep an appointment. If you give yourself this medication at home: If you miss a dose, take it as soon as you can. If it is almost time for your next dose, take only that dose. Do not take double or extra doses. Call your care team with questions. What may interact with this medication? Do not take this medication with any of the following: Abatacept Anakinra Biologic medications, such as certolizumab, etanercept, golimumab, infliximab Live virus vaccines This medication may also interact with the following: Cyclosporine Theophylline Vaccines Warfarin This list may not describe all possible interactions. Give your health care provider a list of all the medicines, herbs, non-prescription drugs,  or dietary supplements you use. Also tell them if you smoke, drink alcohol, or use illegal drugs. Some items may interact with your medicine. What should I watch for while using this medication? Visit your care team for regular checks on your progress. Tell your care team if your symptoms do not  start to get better or if they get worse. You will be tested for tuberculosis (TB) before you start this medication. If your care team prescribes any medication for TB, you should start taking the TB medication before starting this medication. Make sure to finish the full course of TB medication. This medication may increase your risk of getting an infection. Call your care team for advice if you get a fever, chills, sore throat, or other symptoms of a cold or flu. Do not treat yourself. Try to avoid being around people who are sick. Talk to your care team about your risk of cancer. You may be more at risk for certain types of cancer if you take this medication. What side effects may I notice from receiving this medication? Side effects that you should report to your care team as soon as possible: Allergic reactions--skin rash, itching, hives, swelling of the face, lips, tongue, or throat Aplastic anemia--unusual weakness or fatigue, dizziness, headache, trouble breathing, increased bleeding or bruising Body pain, tingling, or numbness Heart failure--shortness of breath, swelling of the ankles, feet, or hands, sudden weight gain, unusual weakness or fatigue Infection--fever, chills, cough, sore throat, wounds that don't heal, pain or trouble when passing urine, general feeling of discomfort or being unwell Lupus-like syndrome--joint pain, swelling, or stiffness, butterfly-shaped rash on the face, rashes that get worse in the sun, fever, unusual weakness or fatigue Unusual bruising or bleeding Side effects that usually do not require medical attention (report to your care team if they continue or are bothersome): Headache Nausea Pain, redness, or irritation at injection site Runny or stuffy nose Sore throat Stomach pain This list may not describe all possible side effects. Call your doctor for medical advice about side effects. You may report side effects to FDA at 1-800-FDA-1088. Where should I  keep my medication? Keep out of the reach of children and pets. Store in the refrigerator. Do not freeze. Keep this medication in the original packaging until you are ready to take it. Protect from light. Get rid of any unused medication after the expiration date. This medication may be stored at room temperature for up to 14 days. Keep this medication in the original packaging. Protect from light. If it is stored at room temperature, get rid of any unused medication after 14 days or after it expires, whichever is first. To get rid of medications that are no longer needed or have expired: Take the medication to a medication take-back program. Check with your pharmacy or law enforcement to find a location. If you cannot return the medication, ask your pharmacist or care team how to get rid of this medication safely. NOTE: This sheet is a summary. It may not cover all possible information. If you have questions about this medicine, talk to your doctor, pharmacist, or health care provider.  2024 Elsevier/Gold Standard (2023-10-27 00:00:00)

## 2024-04-02 NOTE — Telephone Encounter (Signed)
-----   Message from Thais Fill sent at 04/02/2024 11:25 AM EDT ----- Pending OV note from today, please start HUMIRA BIV for seropositive RA Patient signed [employer] form. Provider form pending

## 2024-04-03 LAB — CBC WITH DIFFERENTIAL/PLATELET
Absolute Lymphocytes: 1481 {cells}/uL (ref 850–3900)
Absolute Monocytes: 501 {cells}/uL (ref 200–950)
Basophils Absolute: 41 {cells}/uL (ref 0–200)
Basophils Relative: 0.9 %
Eosinophils Absolute: 92 {cells}/uL (ref 15–500)
Eosinophils Relative: 2 %
HCT: 41 % (ref 35.0–45.0)
Hemoglobin: 13 g/dL (ref 11.7–15.5)
MCH: 25.1 pg — ABNORMAL LOW (ref 27.0–33.0)
MCHC: 31.7 g/dL — ABNORMAL LOW (ref 32.0–36.0)
MCV: 79.3 fL — ABNORMAL LOW (ref 80.0–100.0)
MPV: 11.9 fL (ref 7.5–12.5)
Monocytes Relative: 10.9 %
Neutro Abs: 2484 {cells}/uL (ref 1500–7800)
Neutrophils Relative %: 54 %
Platelets: 209 10*3/uL (ref 140–400)
RBC: 5.17 10*6/uL — ABNORMAL HIGH (ref 3.80–5.10)
RDW: 14.4 % (ref 11.0–15.0)
Total Lymphocyte: 32.2 %
WBC: 4.6 10*3/uL (ref 3.8–10.8)

## 2024-04-03 LAB — COMPREHENSIVE METABOLIC PANEL WITH GFR
AG Ratio: 1.3 (calc) (ref 1.0–2.5)
ALT: 33 U/L — ABNORMAL HIGH (ref 6–29)
AST: 26 U/L (ref 10–35)
Albumin: 3.8 g/dL (ref 3.6–5.1)
Alkaline phosphatase (APISO): 93 U/L (ref 37–153)
BUN: 11 mg/dL (ref 7–25)
CO2: 30 mmol/L (ref 20–32)
Calcium: 10.6 mg/dL — ABNORMAL HIGH (ref 8.6–10.4)
Chloride: 103 mmol/L (ref 98–110)
Creat: 0.61 mg/dL (ref 0.50–1.05)
Globulin: 2.9 g/dL (ref 1.9–3.7)
Glucose, Bld: 99 mg/dL (ref 65–99)
Potassium: 4.1 mmol/L (ref 3.5–5.3)
Sodium: 139 mmol/L (ref 135–146)
Total Bilirubin: 0.7 mg/dL (ref 0.2–1.2)
Total Protein: 6.7 g/dL (ref 6.1–8.1)
eGFR: 99 mL/min/{1.73_m2} (ref >=60)

## 2024-04-03 LAB — SEDIMENTATION RATE: Sed Rate: 19 mm/h (ref 0–30)

## 2024-04-03 LAB — C-REACTIVE PROTEIN: CRP: 3.4 mg/L (ref <8.0)

## 2024-04-08 NOTE — Telephone Encounter (Addendum)
 Humira PA submission pending OV to be signed  Received signed provider form for [employer] PAP. Scanned pt form and provider form to [employer]  Zedrick Springsteen, PharmD, MPH, BCPS, CPP Clinical Pharmacist (Rheumatology and Pulmonology)

## 2024-04-15 ENCOUNTER — Other Ambulatory Visit (HOSPITAL_COMMUNITY): Payer: Self-pay

## 2024-04-15 NOTE — Telephone Encounter (Signed)
 Received notification from [employer] regarding a prior authorization for HUMIRA. Authorization has been APPROVED from 04/15/24 to 10/16/24. Approval letter sent to scan center.  Per test claim, copay for 28 days supply is $1897.12  Authorization # [id number]  RA grant closed.  Submitted Patient Assistance Application to [employer] for HUMIRA along with provider portion, patient portion, PA, medication list, insurance card copy. Will update patient when we receive a response.  Phone: [phone] Fax: [phone]  Geraldene Kleine, PharmD, MPH, BCPS, CPP Clinical Pharmacist (Rheumatology and Pulmonology)

## 2024-04-15 NOTE — Telephone Encounter (Signed)
 Patient is stating that she can not see the message in Mychart. Patient was advised she would be able to see everything in Mychart. Please advise

## 2024-04-15 NOTE — Telephone Encounter (Signed)
 Submitted a Prior Authorization request to [employer] for HUMIRA via CoverMyMeds. Will update once we receive a response.  Key: [id number]   Geraldene Kleine, PharmD, MPH, BCPS, CPP Clinical Pharmacist (Rheumatology and Pulmonology)

## 2024-04-25 ENCOUNTER — Telehealth: Payer: Self-pay | Admitting: *Deleted

## 2024-04-25 NOTE — Telephone Encounter (Signed)
 Patient contacted the office regarding the Humira she is supposed to be receiving. Please reach out to patient to advise.

## 2024-04-25 NOTE — Telephone Encounter (Signed)
 Fax received stating patient was approved for HUMIRA through [employer] patient assistance program through 11/27/2024.   [facility]: [phone]  Tolu Kathlee Barnhardt, PharmD [facility] PGY-1

## 2024-04-26 NOTE — Telephone Encounter (Signed)
 Patient contacted the office again today to schedule her start of the Humira. She advised that she would be receiving it by June 3. Please advise.

## 2024-04-27 ENCOUNTER — Other Ambulatory Visit (HOSPITAL_COMMUNITY): Payer: Self-pay

## 2024-04-29 NOTE — Progress Notes (Unsigned)
 Pharmacy Note  Subjective:   Patient presents to clinic today to receive first dose of Humira for rheumatoid arthritis. Patient currently takes prednisone  10mg  daily (reports about 7 days remaining). Reports increased pain in hands, neck, and shoulders in past week and requesting labwork to assess. She is also concerned about risk for recurrent cellulitis with her lymphedema history.   Her daughter was previously on Humira for hidradenitis suppurativa which patient administered for her. She is familiar with administration technique, storage, and admin sites.   Patient running a fever or have signs/symptoms of infection? No  Patient currently on antibiotics for the treatment of infection? No  Patient have any upcoming invasive procedures/surgeries? No  Objective: CMP     Component Value Date/Time   NA 139 04/02/2024 1124   NA 140 01/01/2024 0937   K 4.1 04/02/2024 1124   CL 103 04/02/2024 1124   CO2 30 04/02/2024 1124   GLUCOSE 99 04/02/2024 1124   BUN 11 04/02/2024 1124   BUN 10 01/01/2024 0937   CREATININE 0.61 04/02/2024 1124   CALCIUM 10.6 (H) 04/02/2024 1124   PROT 6.7 04/02/2024 1124   PROT 6.6 01/01/2024 0937   ALBUMIN 4.0 01/01/2024 0937   AST 26 04/02/2024 1124   ALT 33 (H) 04/02/2024 1124   ALKPHOS 125 (H) 01/01/2024 0937   BILITOT 0.7 04/02/2024 1124   BILITOT 0.5 01/01/2024 0937   GFRNONAA >60 07/22/2020 0910   GFRAA >60 07/22/2020 0910    CBC    Component Value Date/Time   WBC 4.6 04/02/2024 1124   RBC 5.17 (H) 04/02/2024 1124   HGB 13.0 04/02/2024 1124   HGB 12.7 01/01/2024 0937   HCT 41.0 04/02/2024 1124   HCT 38.0 01/01/2024 0937   PLT 209 04/02/2024 1124   PLT 213 01/01/2024 0937   MCV 79.3 (L) 04/02/2024 1124   MCV 78 (L) 01/01/2024 0937   MCH 25.1 (L) 04/02/2024 1124   MCHC 31.7 (L) 04/02/2024 1124   RDW 14.4 04/02/2024 1124   RDW 16.4 (H) 01/01/2024 0937   LYMPHSABS 1.2 01/01/2024 0937   MONOABS 0.6 07/22/2020 0910   EOSABS 92 04/02/2024  1124   EOSABS 0.1 01/01/2024 0937   BASOSABS 41 04/02/2024 1124   BASOSABS 0.0 01/01/2024 0937    Baseline Immunosuppressant Therapy Labs TB GOLD    Latest Ref Rng & Units 11/03/2023    9:01 AM  Quantiferon TB Gold  Quantiferon TB Gold Plus NEGATIVE NEGATIVE    Hepatitis Panel Hepatitis C virus antibody - non reactive on 08/31/2023  HIV Lab Results  Component Value Date   HIV Non Reactive 08/31/2023   Immunoglobulins   SPEP    Latest Ref Rng & Units 04/02/2024   11:24 AM  Serum Protein Electrophoresis  Total Protein 6.1 - 8.1 g/dL 6.7    Chest x-ray: 4/0/9811 - No active cardiopulmonary disease  Assessment/Plan:  Reviewed importance of holding Humira with signs/symptoms of an infections, if antibiotics are prescribed to treat an active infection, and with invasive procedures  Demonstrated proper injection technique with Humira demo device  Patient able to demonstrate proper injection technique using the teach back method.  Patient self injected in the left lower abdomen with:  Sample Medication: Humira 40mg /0.60mL pen injector NDC: [address]-21 Lot: [id number] Expiration: 03/2025  Patient tolerated well.  Observed for 30 mins in office for adverse reaction. Patient denies itchiness and irritation at injection., No swelling or redness noted., and Reviewed injection site reaction management with patient verbally and printed information  for review in AVS  Patient is to return in 1 month for labs and 6-8 weeks for follow-up appointment.  Standing orders for CBC/CMP placed.  TB gold will be monitored yearly.  Referral to [facility] placed today for yearly skin checks while on TNF inhibitor due to risk for non melanoma skin cancer  Humira approved through patient assistance .   Rx sent to: [employer] [facility] for Humira/Rinvoq/Skyrizi: [phone].  Patient has already received shipment at home  Patient will continue Humira 40mg  subcut every 14 days as monotherapy. She  has received shipment at home. She has been advised to complete prednisone  10mg  daily supply as she is reporting pain and swelling in hands.  Orders Placed This Encounter  Procedures   Hepatitis B core antibody, IgM   Hepatitis B surface antigen   Sed Rate (ESR)   C-reactive protein    All questions encouraged and answered.  Instructed patient to call with any further questions or concerns.  Geraldene Kleine, PharmD, MPH, BCPS, CPP Clinical Pharmacist (Rheumatology and Pulmonology)  04/29/2024 10:48 AM

## 2024-04-29 NOTE — Telephone Encounter (Signed)
 Patient scheduled for Humira new start on 05/01/2024. Will use sample. Patient states she is receiving shipment tomorrow from pharmacy. Advised her to place in fridge at home.  She denies any active infection, antibiotic use, or upcoming invasive procedures/surgeries.  Geraldene Kleine, PharmD, MPH, BCPS, CPP Clinical Pharmacist (Rheumatology and Pulmonology)

## 2024-05-01 ENCOUNTER — Ambulatory Visit: Attending: Internal Medicine | Admitting: Pharmacist

## 2024-05-01 DIAGNOSIS — M069 Rheumatoid arthritis, unspecified: Secondary | ICD-10-CM | POA: Diagnosis not present

## 2024-05-01 DIAGNOSIS — Z79899 Other long term (current) drug therapy: Secondary | ICD-10-CM | POA: Diagnosis not present

## 2024-05-01 DIAGNOSIS — R600 Localized edema: Secondary | ICD-10-CM | POA: Diagnosis not present

## 2024-05-01 DIAGNOSIS — Z1159 Encounter for screening for other viral diseases: Secondary | ICD-10-CM

## 2024-05-01 DIAGNOSIS — Z7189 Other specified counseling: Secondary | ICD-10-CM | POA: Diagnosis not present

## 2024-05-01 DIAGNOSIS — G8929 Other chronic pain: Secondary | ICD-10-CM | POA: Diagnosis not present

## 2024-05-01 DIAGNOSIS — Z7952 Long term (current) use of systemic steroids: Secondary | ICD-10-CM | POA: Diagnosis not present

## 2024-05-01 DIAGNOSIS — M25511 Pain in right shoulder: Secondary | ICD-10-CM | POA: Diagnosis not present

## 2024-05-01 MED ORDER — HUMIRA (2 PEN) 40 MG/0.4ML ~~LOC~~ AJKT
40.0000 mg | AUTO-INJECTOR | SUBCUTANEOUS | 0 refills | Status: DC
Start: 2024-05-01 — End: 2024-07-12

## 2024-05-01 NOTE — Patient Instructions (Signed)
 Your next HUMIRA dose is due on 05/15/24, 05/29/24, and every 14 days thereafter  CONTINUE prednisone  10mg  daily  HOLD HUMIRA if you have signs or symptoms of an infection. You can resume once you feel better or back to your baseline. HOLD HUMIRA if you start antibiotics to treat an infection. HOLD HUMIRA around the time of surgery/procedures. Your surgeon will be able to provide recommendations on when to hold BEFORE and when you are cleared to RESUME.  Pharmacy information: Your prescription will be shipped from [facility]. Their phone number is [phone] Patient has already received shipment at home.  Labs are due in 1 month then every 3 months. Lab hours are from Monday to Thursday 8am-12:30pm and 1pm-4pm and Friday 8am-12pm. You do not need an appointment if you come for labs during these times. If you'd like to go to a [facility] or [facility] closer to home, please call our clinic 48 hours prior to lab date so we can release orders in a timely manner.  Stay up to date on all routine vaccines: influenza, pneumonia, COVID19, Shingles  How to manage an injection site reaction: Remember the 5 C's: COUNTER - leave on the counter at least 30 minutes but up to overnight to bring medication to room temperature. This may help prevent stinging COLD - place something cold (like an ice gel pack or cold water bottle) on the injection site just before cleansing with alcohol. This may help reduce pain CLARITIN - use Claritin (generic name is loratadine) for the first two weeks of treatment or the day of, the day before, and the day after injecting. This will help to minimize injection site reactions CORTISONE CREAM - apply if injection site is irritated and itching CALL ME - if injection site reaction is bigger than the size of your fist, looks infected, blisters, or if you develop hives

## 2024-05-01 NOTE — Addendum Note (Signed)
 Addended by: Thais Fill on: 05/01/2024 11:42 AM   Modules accepted: Orders

## 2024-05-02 ENCOUNTER — Ambulatory Visit (INDEPENDENT_AMBULATORY_CARE_PROVIDER_SITE_OTHER): Payer: Medicare Other | Admitting: Family Medicine

## 2024-05-02 ENCOUNTER — Encounter: Payer: Self-pay | Admitting: Family Medicine

## 2024-05-02 VITALS — BP 124/82 | HR 78 | Resp 16 | Ht 61.0 in

## 2024-05-02 DIAGNOSIS — M0579 Rheumatoid arthritis with rheumatoid factor of multiple sites without organ or systems involvement: Secondary | ICD-10-CM

## 2024-05-02 DIAGNOSIS — K219 Gastro-esophageal reflux disease without esophagitis: Secondary | ICD-10-CM | POA: Diagnosis not present

## 2024-05-02 LAB — HEPATITIS B CORE ANTIBODY, IGM: Hep B C IgM: REACTIVE — AB

## 2024-05-02 LAB — HEPATITIS B SURFACE ANTIGEN: Hepatitis B Surface Ag: NONREACTIVE

## 2024-05-02 LAB — C-REACTIVE PROTEIN: CRP: 5.2 mg/L (ref <8.0)

## 2024-05-02 LAB — SEDIMENTATION RATE: Sed Rate: 17 mm/h (ref 0–30)

## 2024-05-02 NOTE — Patient Instructions (Addendum)
 I appreciate the opportunity to provide care to you today!    Follow up:  4 months  Labs: next visit  For a Healthier YOU, I Recommend: Reducing your intake of sugar, sodium, carbohydrates, and saturated fats. Increasing your fiber intake by incorporating more whole grains, fruits, and vegetables into your meals. Setting healthy goals with a focus on lowering your consumption of carbs, sugar, and unhealthy fats. Adding variety to your diet by including a wide range of fruits and vegetables. Cutting back on soda and limiting processed foods as much as possible. Staying active: In addition to taking your weight loss medication, aim for at least 150 minutes of moderate-intensity physical activity each week for optimal results.  Please follow up if your symptoms worsen or fail to improve.    Please continue to a heart-healthy diet and increase your physical activities. Try to exercise for at least five days a week.    It was a pleasure to see you and I look forward to continuing to work together on your health and well-being. Please do not hesitate to call the office if you need care or have questions about your care.  In case of emergency, please visit the Emergency Department for urgent care, or contact our clinic at [phone] to schedule an appointment. We're here to help you!   Have a wonderful day and week. With Gratitude, Lamika Connolly MSN, FNP-BC

## 2024-05-02 NOTE — Assessment & Plan Note (Addendum)
Encouraged to continue taking Protonix 40 mg daily GERD diet encouraged

## 2024-05-02 NOTE — Progress Notes (Signed)
 Established Patient Office Visit  Subjective:  Patient ID: [name], female    DOB: 05/04/1961  Age: 63 y.o. MRN: [id number]  CC:  Chief Complaint  Patient presents with   Follow-up    4 month follow up     HPI AALIA Estrada is a 63 y.o. female with past medical history of rheumatoid arthritis, GERD presents for f/u of  chronic medical conditions.  For the details of today's visit, please refer to the assessment and plan.     Past Medical History:  Diagnosis Date   Adenocarcinoma of left breast (HCC)    double mastectomy 1990 then 2002    Anemia    Arthritis    rheumatoid   Asthma    BRCA1 positive, suspected 06/10/2014   Mother is BRCA1 +, sister died at 24 from breast cancer, another sister had breast cancer too.   Breast cancer (HCC)    double mastectomy 1990 then 2002   Chronic acquired lymphedema    COPD (chronic obstructive pulmonary disease) (HCC)    Family history of breast cancer    Family history of ovarian cancer    Family history of pancreatic cancer    High cholesterol    Infiltrating ductal carcinoma of right breast (HCC) 06/10/2014   Personal history of malignant neoplasm of breast    S/P bilateral salpingo-oophorectomy 06/10/2014   August 2010    Past Surgical History:  Procedure Laterality Date   ABDOMINAL HYSTERECTOMY     complete hysterectomy   BREAST SURGERY     double mastectomy     MASTECTOMY      Family History  Problem Relation Age of Onset   Breast cancer Mother        dx < 38   Breast cancer Sister 7   Ovarian cancer Sister 68   Breast cancer Sister        d. 45   Breast cancer Maternal Uncle    Breast cancer Maternal Grandmother        dx < 50   Pancreatic cancer Cousin        mat first cousin: reportedly BRCA1 pos    Social History   Socioeconomic History   Marital status: Married    Spouse name: Not on file   Number of children: Not on file   Years of education: Not on file   Highest education level: 12th  grade  Occupational History   Not on file  Tobacco Use   Smoking status: Never    Passive exposure: Never   Smokeless tobacco: Never  Vaping Use   Vaping status: Never Used  Substance and Sexual Activity   Alcohol use: No   Drug use: No   Sexual activity: Yes    Birth control/protection: Surgical  Other Topics Concern   Not on file  Social History Narrative   Not on file   Social Drivers of Health   Financial Resource Strain: Low Risk  (04/30/2024)   Overall Financial Resource Strain (CARDIA)    Difficulty of Paying Living Expenses: Not very hard  Food Insecurity: No Food Insecurity (04/30/2024)   Hunger Vital Sign    Worried About Running Out of Food in the Last Year: Never true    Ran Out of Food in the Last Year: Never true  Transportation Needs: No Transportation Needs (04/30/2024)   PRAPARE - Administrator, Civil Service (Medical): No    Lack of Transportation (Non-Medical): No  Physical Activity:  Sufficiently Active (04/30/2024)   Exercise Vital Sign    Days of Exercise per Week: 5 days    Minutes of Exercise per Session: 30 min  Stress: No Stress Concern Present (04/30/2024)   [employer] of Occupational Health - Occupational Stress Questionnaire    Feeling of Stress : Not at all  Social Connections: Unknown (04/30/2024)   Social Connection and Isolation Panel [NHANES]    Frequency of Communication with Friends and Family: Patient declined    Frequency of Social Gatherings with Friends and Family: Patient declined    Attends Religious Services: More than 4 times per year    Active Member of [employer] or Organizations: No    Attends Engineer, structural: Not on file    Marital Status: Married  Catering manager Violence: Not on file    Outpatient Medications Prior to Visit  Medication Sig Dispense Refill   adalimumab (HUMIRA, 2 PEN,) 40 MG/0.4ML pen Inject 0.4 mLs (40 mg total) into the skin every 14 (fourteen) days. 1 kit - 2 pens 6 each 0    albuterol  (ACCUNEB ) 0.63 MG/3ML nebulizer solution Take 1 ampule by nebulization 2 (two) times daily.     albuterol  (VENTOLIN  HFA) 108 (90 Base) MCG/ACT inhaler USE 2 INHALATIONS BY MOUTH EVERY 4 HOURS AS NEEDED 54 g 6   Cyanocobalamin (B-12) 1000 MCG TABS Take by mouth.     Ferrous Gluconate (IRON 27 PO) Take 65 mg by mouth every other day.     fluticasone (FLONASE) 50 MCG/ACT nasal spray Place into both nostrils.     furosemide  (LASIX ) 40 MG tablet TAKE 1 TABLET BY MOUTH DAILY 60 tablet 0   hydrochlorothiazide  (HYDRODIURIL ) 25 MG tablet Take 1 tablet (25 mg total) by mouth daily. 90 tablet 1   naproxen sodium (ALEVE) 220 MG tablet Take 220 mg by mouth.     Oxymetazoline HCl (NASAL SPRAY NA) Place into the nose. otc     pantoprazole  (PROTONIX ) 40 MG tablet TAKE 1 TABLET BY MOUTH DAILY 100 tablet 2   pravastatin  (PRAVACHOL ) 40 MG tablet Take 1 tablet (40 mg total) by mouth daily. 60 tablet 1   predniSONE  (DELTASONE ) 5 MG tablet Take 2 tablets (10 mg total) by mouth daily with breakfast. 60 tablet 1   RESTASIS  0.05 % ophthalmic emulsion Place 1 drop into both eyes 2 (two) times daily. 0.4 mL 3   predniSONE  (DELTASONE ) 5 MG tablet Take 1 tablet (5 mg total) by mouth daily with breakfast. 90 tablet 0   VITAMIN D  PO Take by mouth. (Patient not taking: Reported on 05/02/2024)     No facility-administered medications prior to visit.    No Known Allergies  ROS Review of Systems  Constitutional:  Negative for chills and fever.  Eyes:  Negative for visual disturbance.  Respiratory:  Negative for chest tightness and shortness of breath.   Neurological:  Negative for dizziness and headaches.      Objective:     Physical Exam HENT:     Head: Normocephalic.     Mouth/Throat:     Mouth: Mucous membranes are moist.  Cardiovascular:     Rate and Rhythm: Normal rate.     Heart sounds: Normal heart sounds.  Pulmonary:     Effort: Pulmonary effort is normal.     Breath sounds: Normal breath  sounds.  Neurological:     Mental Status: She is alert.     BP 124/82   Pulse 78   Resp 16  Ht 5\' 1"  (1.549 m)   SpO2 97%   BMI 35.71 kg/m  Wt Readings from Last 3 Encounters:  01/04/24 189 lb (85.7 kg)  01/01/24 193 lb 1.9 oz (87.6 kg)  11/03/23 189 lb (85.7 kg)    Lab Results  Component Value Date   TSH 3.060 01/01/2024   Lab Results  Component Value Date   WBC 4.6 04/02/2024   HGB 13.0 04/02/2024   HCT 41.0 04/02/2024   MCV 79.3 (L) 04/02/2024   PLT 209 04/02/2024   Lab Results  Component Value Date   NA 139 04/02/2024   K 4.1 04/02/2024   CO2 30 04/02/2024   GLUCOSE 99 04/02/2024   BUN 11 04/02/2024   CREATININE 0.61 04/02/2024   BILITOT 0.7 04/02/2024   ALKPHOS 125 (H) 01/01/2024   AST 26 04/02/2024   ALT 33 (H) 04/02/2024   PROT 6.7 04/02/2024   ALBUMIN 4.0 01/01/2024   CALCIUM 10.6 (H) 04/02/2024   ANIONGAP 7 07/22/2020   EGFR 99 04/02/2024   Lab Results  Component Value Date   CHOL 167 01/01/2024   Lab Results  Component Value Date   HDL 70 01/01/2024   Lab Results  Component Value Date   LDLCALC 83 01/01/2024   Lab Results  Component Value Date   TRIG 71 01/01/2024   Lab Results  Component Value Date   CHOLHDL 2.4 01/01/2024   Lab Results  Component Value Date   HGBA1C 6.2 (H) 01/01/2024      Assessment & Plan:  Rheumatoid arthritis involving multiple sites with positive rheumatoid factor (HCC) Assessment & Plan: The patient is currently under the care of rheumatology and was recently started on Humira subcutaneous injections. She reports doing well on the treatment without any adverse effects. She is also currently taking Prednisone  10 mg daily with breakfast as part of her ongoing management plan.    Gastroesophageal reflux disease without esophagitis Assessment & Plan: Encouraged to continue taking Protonix  40 mg daily GERD diet encouraged   Note: This chart has been completed using Engineer, civil (consulting) software,  and while attempts have been made to ensure accuracy, certain words and phrases may not be transcribed as intended.    Follow-up: Return in about 4 months (around 09/01/2024).   Elia Keenum, FNP

## 2024-05-02 NOTE — Assessment & Plan Note (Signed)
 The patient is currently under the care of rheumatology and was recently started on Humira subcutaneous injections. She reports doing well on the treatment without any adverse effects. She is also currently taking Prednisone  10 mg daily with breakfast as part of her ongoing management plan.

## 2024-05-06 ENCOUNTER — Telehealth: Payer: Self-pay

## 2024-05-06 NOTE — Telephone Encounter (Signed)
 Patient contacted the office requesting her lab results from 05/01/2024. Please advise.

## 2024-05-07 ENCOUNTER — Ambulatory Visit: Payer: Self-pay | Admitting: Internal Medicine

## 2024-05-07 NOTE — Progress Notes (Signed)
 Sed rate and CRP were normal which is good and does not indicate the high level of inflammation.  The hepatitis antibody test was consistent with someone who has previously been exposed to hepatitis B but does not have any active or recent infection.  This is not a problem for the current medication plan but would be relevant if using certain other rheumatoid arthritis medication.

## 2024-05-07 NOTE — Telephone Encounter (Signed)
 Now addressed in associated result note

## 2024-05-07 NOTE — Telephone Encounter (Signed)
 Patient advised in result note.

## 2024-05-21 ENCOUNTER — Other Ambulatory Visit: Payer: Self-pay | Admitting: Family Medicine

## 2024-05-21 DIAGNOSIS — I890 Lymphedema, not elsewhere classified: Secondary | ICD-10-CM

## 2024-05-29 ENCOUNTER — Other Ambulatory Visit: Payer: Self-pay

## 2024-05-29 ENCOUNTER — Telehealth: Payer: Self-pay

## 2024-05-29 DIAGNOSIS — M069 Rheumatoid arthritis, unspecified: Secondary | ICD-10-CM

## 2024-05-29 DIAGNOSIS — Z79899 Other long term (current) drug therapy: Secondary | ICD-10-CM

## 2024-05-29 NOTE — Telephone Encounter (Signed)
 Patient called the office to see when she was due to get her labs after starting Humira , advised she can have them done tomorrow since the office will be closed Friday July 4th.

## 2024-05-30 DIAGNOSIS — Z79899 Other long term (current) drug therapy: Secondary | ICD-10-CM | POA: Diagnosis not present

## 2024-05-30 DIAGNOSIS — M069 Rheumatoid arthritis, unspecified: Secondary | ICD-10-CM | POA: Diagnosis not present

## 2024-05-30 LAB — CBC WITH DIFFERENTIAL/PLATELET
Absolute Lymphocytes: 1486 {cells}/uL (ref 850–3900)
Absolute Monocytes: 510 {cells}/uL (ref 200–950)
Basophils Absolute: 51 {cells}/uL (ref 0–200)
Basophils Relative: 1.5 %
Eosinophils Absolute: 129 {cells}/uL (ref 15–500)
Eosinophils Relative: 3.8 %
HCT: 44.8 % (ref 35.0–45.0)
Hemoglobin: 13.5 g/dL (ref 11.7–15.5)
MCH: 24.4 pg — ABNORMAL LOW (ref 27.0–33.0)
MCHC: 30.1 g/dL — ABNORMAL LOW (ref 32.0–36.0)
MCV: 80.9 fL (ref 80.0–100.0)
MPV: 11.9 fL (ref 7.5–12.5)
Monocytes Relative: 15 %
Neutro Abs: 1224 {cells}/uL — ABNORMAL LOW (ref 1500–7800)
Neutrophils Relative %: 36 %
Platelets: 196 10*3/uL (ref 140–400)
RBC: 5.54 10*6/uL — ABNORMAL HIGH (ref 3.80–5.10)
RDW: 14.8 % (ref 11.0–15.0)
Total Lymphocyte: 43.7 %
WBC: 3.4 10*3/uL — ABNORMAL LOW (ref 3.8–10.8)

## 2024-05-30 LAB — COMPREHENSIVE METABOLIC PANEL WITH GFR
AG Ratio: 1.3 (calc) (ref 1.0–2.5)
ALT: 57 U/L — ABNORMAL HIGH (ref 6–29)
AST: 44 U/L — ABNORMAL HIGH (ref 10–35)
Albumin: 4 g/dL (ref 3.6–5.1)
Alkaline phosphatase (APISO): 113 U/L (ref 37–153)
BUN: 13 mg/dL (ref 7–25)
CO2: 29 mmol/L (ref 20–32)
Calcium: 11.4 mg/dL — ABNORMAL HIGH (ref 8.6–10.4)
Chloride: 104 mmol/L (ref 98–110)
Creat: 0.71 mg/dL (ref 0.50–1.05)
Globulin: 3 g/dL (ref 1.9–3.7)
Glucose, Bld: 110 mg/dL (ref 65–139)
Potassium: 4.1 mmol/L (ref 3.5–5.3)
Sodium: 139 mmol/L (ref 135–146)
Total Bilirubin: 0.8 mg/dL (ref 0.2–1.2)
Total Protein: 7 g/dL (ref 6.1–8.1)
eGFR: 94 mL/min/{1.73_m2} (ref >=60)

## 2024-06-06 ENCOUNTER — Other Ambulatory Visit: Payer: Self-pay | Admitting: Internal Medicine

## 2024-06-06 NOTE — Telephone Encounter (Signed)
 Last Fill: 02/22/2024  Next Visit: 07/15/2024  Last Visit: 04/02/2024  Dx: Rheumatoid arthritis involving multiple sites, unspecified whether rheumatoid factor present (HCC)   Current Dose per office note on 04/02/2024: Managed with prednisone  10 mg daily. Discussed alternative treatments: hydroxychloroquine and injectable biologics (Humira  or Enbrel). Injectable biologics preferred to reduce steroid use and avoid liver issues.   Okay to refill Prednisone ?

## 2024-06-19 NOTE — Addendum Note (Signed)
 Addended by: DAYNE SHERRY RAMAN on: 06/19/2024 10:06 AM   Modules accepted: Level of Service

## 2024-06-28 ENCOUNTER — Telehealth: Payer: Self-pay

## 2024-06-28 NOTE — Telephone Encounter (Signed)
 Patient contacted the office regarding a Humira  refill. Advised the patient it is too early. Patient states she will call back again in a couple of weeks. Patient also inquired if she needed to update lab work yet. Advised the patient she should be good for lab work until October.

## 2024-07-02 NOTE — Progress Notes (Signed)
 Office Visit Note  Patient: [name]             Date of Birth: [birth year]           MRN: [id number]             PCP: Zarwolo, Gloria, FNP Referring: Zarwolo, Gloria, FNP Visit Date: 07/15/2024   Subjective:  Follow-up   Discussed the use of AI scribe software for clinical note transcription with the patient, who gave verbal consent to proceed.  History of Present Illness   [name] is a 63 y.o. female here for follow up with rheumatoid arthritis on Humira  40 mg every 14 days and prednisone  10 mg daily.    She has been receiving [employer] injections since June and reports she has not had a real bad flare up like she was having before. She uses prednisone  as needed, approximately two days at a time, three times a month, and notes that most days her symptoms are manageable without medication.  In July, her lab results showed a decreased white blood cell count, specifically neutrophils, at 1200. Her calcium levels have been consistently high, with a recent measurement of 11.6. She has reduced her calcium intake but is unsure why the levels remain elevated. She denies taking vitamin D  or calcium supplements currently.  She experiences swelling in her legs, which she attributes to lymphedema, and wonders if it could be related to Humira , as she read that it can cause swelling. Elevating her legs at night reduces the swelling by morning, though she experiences numbness when sitting with her legs elevated for extended periods.  She is currently taking iron supplements, initially prescribed by another doctor, but is unsure if she still needs them as her blood count is fine. She has not been sick since her last visit.       Previous HPI 04/02/2024 [name] is a 64 y.o. female here for follow up with rheumatoid arthritis on prednisone  10 mg daily.     She has a history of rheumatoid arthritis and was previously on methotrexate , which was discontinued in February due to  elevated liver enzymes. Her liver enzymes returned to normal levels by March. Since stopping methotrexate , she has been managing her rheumatoid arthritis with prednisone , initially at 5 mg, but increased to 10 mg daily in March due to worsening symptoms.   In February, she experienced significant joint pain, particularly in her hands and shoulders, describing it as severe enough that she 'couldn't close my hands' and 'couldn't raise my arm.' By April, her hand pain had improved but was still present.   Her joint pain worsened after stopping methotrexate , but she has had similar flare-ups in the past.   She had a new bone density test that looked good.  Bone density was in osteopenic range at her lowest areas in the total femur and wrist and in the normal range at the vertebra.  Total FRAX was low risk after accounting for RA and glucocorticoid use.     Previous HPI 01/04/2024 [name] is a 63 year old female with rheumatoid arthritis on methotrexate  15 mg PO weekly and folic acid 1 mg daily and prednisone  5 mg daily. She presents with concerns about medication side effects and elevated calcium levels.   She is currently managed with methotrexate  and low-dose prednisone  for rheumatoid arthritis. She is concerned about recent blood tests showing slightly abnormal liver enzyme levels, which she worries might be due to methotrexate ,  known to affect the liver.   Recent blood tests revealed elevated calcium levels at 11.3 mg/dL, above the normal range. She is unsure of the cause but mentions taking a vitamin D  supplement that may contain calcium. She has not experienced kidney stones recently, although she had one in the past.   Regarding her rheumatoid arthritis, she has antibodies for the condition, but her inflammation markers were stable. She experiences occasional swelling in her fingers, specifically the left second and third PIP joints, which have decreased since last week. She also reports  intermittent pain and stiffness in her right shoulder, particularly when raising her arm, which she attributes to calcification seen on her x-rays. Her left shoulder appears normal on x-rays, with no significant osteoarthritis or joint damage noted.      Previous HPI 11/03/23 [name] is a 63 y.o. female here to establish care for rheumatoid arthritis. She has been on methotrexate  20 mg PO weekly and prednisone  5 mg daily managed through her PCP office but with persistent symptoms involving both hands and has been unable to taper or discontinue steroid use. The patient has been experiencing these symptoms for over a decade, with the onset around 2009-2013. The pain initially presented in the legs, impairing mobility, and later spread to the hands, causing difficulty in closing them, and the shoulders, limiting arm movement. The patient also reported difficulty opening their mouth due to jaw pain. She was evaluated apparently by exam and xrays indicated rheumatoid arthritis. She started on treatment initially with methotrexate  and with intermittent prednisone  tapers. The patient has been on methotrexate  for approximately ten years. She also takes aleve intermittently for pain as needed.   In October, the patient experienced a severe flare-up, with significant hand swelling and pain that lasted for about a week and a half. This flare-up also affected the patient's shoulders and arms, causing them to become hot and swollen. The patient also reported a burning, tingling sensation in the arms lasting for several hours.   The patient has a history of breast cancer treated with chemotherapy, radiation, and mastectomy, which has led to numbness in the tips of the fingers and toes. The patient also has lymphedema, which has caused swelling in the arms. The patient manages this with a body suit for compression and circulation.   Review of Systems  Constitutional:  Negative for fatigue.  HENT:  Positive for  mouth dryness. Negative for mouth sores.   Eyes:  Negative for dryness.  Respiratory:  Negative for shortness of breath.   Cardiovascular:  Positive for swelling in legs/feet. Negative for chest pain and palpitations.  Gastrointestinal:  Negative for blood in stool, constipation and diarrhea.  Endocrine: Negative for increased urination.  Genitourinary:  Negative for involuntary urination.  Musculoskeletal:  Positive for joint pain, joint pain, joint swelling, myalgias, morning stiffness, muscle tenderness and myalgias. Negative for gait problem and muscle weakness.  Skin:  Negative for color change, rash, hair loss and sensitivity to sunlight.  Allergic/Immunologic: Negative for susceptible to infections.  Neurological:  Negative for dizziness and headaches.  Hematological:  Negative for swollen glands.  Psychiatric/Behavioral:  Negative for depressed mood and sleep disturbance. The patient is not nervous/anxious.     PMFS History:  Patient Active Problem List   Diagnosis Date Noted   Pedal edema 07/15/2024   Pain in right shoulder 01/04/2024   Osteoporosis screening 01/04/2024   GERD (gastroesophageal reflux disease) 01/01/2024   High risk medication use 11/03/2023   Genetic testing  01/27/2022   Family history of breast cancer 01/06/2022   Family history of ovarian cancer 01/06/2022   Family history of pancreatic cancer 01/06/2022   Personal history of malignant neoplasm of breast 01/06/2022   Infiltrating ductal carcinoma of right breast (HCC) 06/10/2014   BRCA1 positive, suspected 06/10/2014   S/P bilateral salpingo-oophorectomy 06/10/2014   Cellulitis 03/26/2014   Nausea and vomiting 03/26/2014   Cellulitis of left arm 03/26/2014   Lymphedema 03/26/2014   COPD (chronic obstructive pulmonary disease) (HCC)    Rheumatoid arthritis involving multiple sites (HCC)    Anemia    High cholesterol    Adenocarcinoma of left breast [facility])     Past Medical History:  Diagnosis Date    Adenocarcinoma of left breast (HCC)    double mastectomy 1990 then 2002    Anemia    Arthritis    rheumatoid   Asthma    BRCA1 positive, suspected 06/10/2014   Mother is BRCA1 +, sister died at 66 from breast cancer, another sister had breast cancer too.   Breast cancer (HCC)    double mastectomy 1990 then 2002   Chronic acquired lymphedema    COPD (chronic obstructive pulmonary disease) (HCC)    Family history of breast cancer    Family history of ovarian cancer    Family history of pancreatic cancer    High cholesterol    Infiltrating ductal carcinoma of right breast (HCC) 06/10/2014   Personal history of malignant neoplasm of breast    S/P bilateral salpingo-oophorectomy 06/10/2014   August 2010    Family History  Problem Relation Age of Onset   Breast cancer Mother        dx < 29   Breast cancer Sister 24   Ovarian cancer Sister 19   Breast cancer Sister        d. 44   Breast cancer Maternal Uncle    Breast cancer Maternal Grandmother        dx < 50   Pancreatic cancer Cousin        mat first cousin: reportedly BRCA1 pos   Past Surgical History:  Procedure Laterality Date   ABDOMINAL HYSTERECTOMY     complete hysterectomy   BREAST SURGERY     double mastectomy     MASTECTOMY     Social History   Social History Narrative   Not on file   Immunization History  Administered Date(s) Administered   Zoster Recombinant(Shingrix) 04/08/2022, 10/04/2022     Objective: Vital Signs: BP 115/62 (BP Location: Right Arm, Patient Position: Sitting, Cuff Size: Normal)   Pulse 68   Resp 16   Ht 5' 1 (1.549 m)   BMI 35.71 kg/m    Physical Exam Constitutional:      Appearance: She is obese.  Eyes:     Conjunctiva/sclera: Conjunctivae normal.  Cardiovascular:     Rate and Rhythm: Normal rate and regular rhythm.  Pulmonary:     Effort: Pulmonary effort is normal.     Breath sounds: Normal breath sounds.  Musculoskeletal:     Comments: Lymphedema bilateral  upper extremities  Lymphadenopathy:     Cervical: No cervical adenopathy.  Skin:    General: Skin is warm and dry.  Neurological:     Mental Status: She is alert.  Psychiatric:        Mood and Affect: Mood normal.      Musculoskeletal Exam:  Shoulders full ROM, no swelling, mild tenderness to pressure also over traps Elbows full  ROM no tenderness or swelling Wrists full ROM no tenderness or swelling Fingers full ROM no palpable synovitis, 2nd-3rd fingers PIP and DIP tenderness Knees full ROM no tenderness or swelling    Investigation: No additional findings.  Imaging: No results found.  Recent Labs: Lab Results  Component Value Date   WBC 4.1 07/15/2024   HGB 13.9 07/15/2024   PLT 201 07/15/2024   NA 138 07/15/2024   K 4.7 07/15/2024   CL 102 07/15/2024   CO2 29 07/15/2024   GLUCOSE 103 (H) 07/15/2024   BUN 13 07/15/2024   CREATININE 0.62 07/15/2024   BILITOT 0.7 07/15/2024   ALKPHOS 125 (H) 01/01/2024   AST 34 07/15/2024   ALT 52 (H) 07/15/2024   PROT 7.3 07/15/2024   ALBUMIN 4.0 01/01/2024   CALCIUM 11.2 (H) 07/15/2024   GFRAA >60 07/22/2020   QFTBGOLDPLUS NEGATIVE 11/03/2023    Speciality Comments: No specialty comments available.  Procedures:  No procedures performed Allergies: Patient has no known allergies.   Assessment / Plan:     Visit Diagnoses: Rheumatoid arthritis involving multiple sites, unspecified whether rheumatoid factor present (HCC) Managed with Humira  injections, reducing flare-ups. Prednisone  used as needed, approximately three times a month. -Checking sed rate for disease activity monitoring - Continue Humira  40 mg subcu q. 14 days - Prednisone  5 mg daily as needed, currently at acceptably low frequency would not be high risk treatment  High risk medication use - Humira  40 mg every 14 days Neutropenia secondary to immunosuppressive therapy Neutropenia with WBC count of 1200 due to Humira . Current levels do not require stopping  Humira . Monitor to prevent serious infections. - Recheck white blood cell count today. - Monitor neutrophil levels closely. - Consider alternative medication if neutrophil count decreases further.  Long term (current) use of systemic steroids - prednisone  10 mg daily  Hypercalcemia - Discontinue calcium supplementation Hypercalcemia of unclear etiology Persistent hypercalcemia with calcium level of 11.6. Etiology unclear; potential causes include parathyroid  hormone imbalance or sarcoidosis. Prednisone  use unlikely cause. - Order parathyroid  hormone test. - Evaluate for potential referral to endocrinologist if parathyroid  hormone is abnormal. - Monitor calcium levels closely.       Orders: Orders Placed This Encounter  Procedures   Sedimentation rate   CBC with Differential/Platelet   Comprehensive metabolic panel with GFR   Parathyroid  hormone, intact (no Ca)   B Nat Peptide   Meds ordered this encounter  Medications   adalimumab  (HUMIRA , 2 PEN,) 40 MG/0.4ML pen    Sig: Inject 0.4 mLs (40 mg total) into the skin every 14 (fourteen) days. 1 kit - 2 pens    Dispense:  2.4 mL    Refill:  0     Follow-Up Instructions: Return in about 3 months (around 10/15/2024) for RA on ADA/GC f/u 3mos.   Lonni LELON Ester, MD  Note - This record has been created using [employer].  Chart creation errors have been sought, but may not always  have been located. Such creation errors do not reflect on  the standard of medical care.

## 2024-07-12 ENCOUNTER — Other Ambulatory Visit: Payer: Self-pay | Admitting: *Deleted

## 2024-07-12 DIAGNOSIS — Z7952 Long term (current) use of systemic steroids: Secondary | ICD-10-CM

## 2024-07-12 DIAGNOSIS — Z1159 Encounter for screening for other viral diseases: Secondary | ICD-10-CM

## 2024-07-12 DIAGNOSIS — Z79899 Other long term (current) drug therapy: Secondary | ICD-10-CM

## 2024-07-12 DIAGNOSIS — M069 Rheumatoid arthritis, unspecified: Secondary | ICD-10-CM

## 2024-07-12 MED ORDER — HUMIRA (2 PEN) 40 MG/0.4ML ~~LOC~~ AJKT: 40.0000 mg | AUTO-INJECTOR | SUBCUTANEOUS | 0 refills | Status: DC

## 2024-07-12 NOTE — Telephone Encounter (Signed)
 Refill request received via fax from [facility] for Humira   Last Fill: 05/01/2024  Labs: 05/30/2024 Calcium 11.4, AST 44, ALT 57, WBC 3.4, RBC 5.54, MCH 24.4, MCHC 30.1, Neutro Abs 1,224  TB Gold: 11/03/2023 Neg    Next Visit: 07/15/2024  Last Visit: 04/02/2024  IK:Myzlfjunpi arthritis involving multiple sites, unspecified whether rheumatoid factor present   Current Dose per office note 04/02/2024: Humira  40 mg every 14 days.   Okay to refill Humira ?

## 2024-07-15 ENCOUNTER — Telehealth: Payer: Self-pay | Admitting: Internal Medicine

## 2024-07-15 ENCOUNTER — Encounter: Payer: Self-pay | Admitting: Internal Medicine

## 2024-07-15 ENCOUNTER — Ambulatory Visit: Attending: Internal Medicine | Admitting: Internal Medicine

## 2024-07-15 VITALS — BP 115/62 | HR 68 | Resp 16 | Ht 61.0 in

## 2024-07-15 DIAGNOSIS — Z1159 Encounter for screening for other viral diseases: Secondary | ICD-10-CM | POA: Diagnosis not present

## 2024-07-15 DIAGNOSIS — G8929 Other chronic pain: Secondary | ICD-10-CM | POA: Diagnosis not present

## 2024-07-15 DIAGNOSIS — M25511 Pain in right shoulder: Secondary | ICD-10-CM

## 2024-07-15 DIAGNOSIS — Z7952 Long term (current) use of systemic steroids: Secondary | ICD-10-CM

## 2024-07-15 DIAGNOSIS — R600 Localized edema: Secondary | ICD-10-CM

## 2024-07-15 DIAGNOSIS — I890 Lymphedema, not elsewhere classified: Secondary | ICD-10-CM | POA: Diagnosis not present

## 2024-07-15 DIAGNOSIS — M069 Rheumatoid arthritis, unspecified: Secondary | ICD-10-CM | POA: Diagnosis not present

## 2024-07-15 DIAGNOSIS — Z79899 Other long term (current) drug therapy: Secondary | ICD-10-CM

## 2024-07-15 NOTE — Telephone Encounter (Signed)
 Patient contacted the office to request a medication refill.   1. Name of Medication: Humira   2. How are you currently taking this medication (dosage and times per day)? 1 injection 14 days   3. What pharmacy would you like for that to be sent to? [facility]

## 2024-07-15 NOTE — Telephone Encounter (Signed)
 Refill pended to todays visit.

## 2024-07-15 NOTE — Patient Instructions (Signed)
 May need sooner f/u if results indicate treatment change

## 2024-07-16 LAB — COMPREHENSIVE METABOLIC PANEL WITH GFR
AG Ratio: 1.3 (calc) (ref 1.0–2.5)
ALT: 52 U/L — ABNORMAL HIGH (ref 6–29)
AST: 34 U/L (ref 10–35)
Albumin: 4.1 g/dL (ref 3.6–5.1)
Alkaline phosphatase (APISO): 124 U/L (ref 37–153)
BUN: 13 mg/dL (ref 7–25)
CO2: 29 mmol/L (ref 20–32)
Calcium: 11.2 mg/dL — ABNORMAL HIGH (ref 8.6–10.4)
Chloride: 102 mmol/L (ref 98–110)
Creat: 0.62 mg/dL (ref 0.50–1.05)
Globulin: 3.2 g/dL (ref 1.9–3.7)
Glucose, Bld: 103 mg/dL — ABNORMAL HIGH (ref 65–99)
Potassium: 4.7 mmol/L (ref 3.5–5.3)
Sodium: 138 mmol/L (ref 135–146)
Total Bilirubin: 0.7 mg/dL (ref 0.2–1.2)
Total Protein: 7.3 g/dL (ref 6.1–8.1)
eGFR: 99 mL/min/1.73m2 (ref >=60)

## 2024-07-16 LAB — CBC WITH DIFFERENTIAL/PLATELET
Absolute Lymphocytes: 1775 {cells}/uL (ref 850–3900)
Absolute Monocytes: 631 {cells}/uL (ref 200–950)
Basophils Absolute: 49 {cells}/uL (ref 0–200)
Basophils Relative: 1.2 %
Eosinophils Absolute: 70 {cells}/uL (ref 15–500)
Eosinophils Relative: 1.7 %
HCT: 45.1 % — ABNORMAL HIGH (ref 35.0–45.0)
Hemoglobin: 13.9 g/dL (ref 11.7–15.5)
MCH: 24.7 pg — ABNORMAL LOW (ref 27.0–33.0)
MCHC: 30.8 g/dL — ABNORMAL LOW (ref 32.0–36.0)
MCV: 80.2 fL (ref 80.0–100.0)
MPV: 11.2 fL (ref 7.5–12.5)
Monocytes Relative: 15.4 %
Neutro Abs: 1574 {cells}/uL (ref 1500–7800)
Neutrophils Relative %: 38.4 %
Platelets: 201 Thousand/uL (ref 140–400)
RBC: 5.62 Million/uL — ABNORMAL HIGH (ref 3.80–5.10)
RDW: 15.1 % — ABNORMAL HIGH (ref 11.0–15.0)
Total Lymphocyte: 43.3 %
WBC: 4.1 Thousand/uL (ref 3.8–10.8)

## 2024-07-16 LAB — PARATHYROID HORMONE, INTACT (NO CA): PTH: 90 pg/mL — ABNORMAL HIGH (ref 16–77)

## 2024-07-16 LAB — SEDIMENTATION RATE: Sed Rate: 14 mm/h (ref 0–30)

## 2024-07-16 LAB — BRAIN NATRIURETIC PEPTIDE: Brain Natriuretic Peptide: 33 pg/mL (ref <100)

## 2024-07-24 MED ORDER — HUMIRA (2 PEN) 40 MG/0.4ML ~~LOC~~ AJKT: 40.0000 mg | AUTO-INJECTOR | SUBCUTANEOUS | 0 refills | Status: DC

## 2024-08-14 ENCOUNTER — Other Ambulatory Visit: Payer: Self-pay | Admitting: Family Medicine

## 2024-08-14 DIAGNOSIS — I890 Lymphedema, not elsewhere classified: Secondary | ICD-10-CM

## 2024-09-05 ENCOUNTER — Encounter: Payer: Self-pay | Admitting: Family Medicine

## 2024-09-05 ENCOUNTER — Ambulatory Visit (INDEPENDENT_AMBULATORY_CARE_PROVIDER_SITE_OTHER): Admitting: Family Medicine

## 2024-09-05 VITALS — BP 122/76 | HR 68 | Resp 18 | Ht 61.0 in

## 2024-09-05 DIAGNOSIS — J42 Unspecified chronic bronchitis: Secondary | ICD-10-CM | POA: Diagnosis not present

## 2024-09-05 DIAGNOSIS — K219 Gastro-esophageal reflux disease without esophagitis: Secondary | ICD-10-CM | POA: Diagnosis not present

## 2024-09-05 DIAGNOSIS — E559 Vitamin D deficiency, unspecified: Secondary | ICD-10-CM | POA: Diagnosis not present

## 2024-09-05 DIAGNOSIS — E7849 Other hyperlipidemia: Secondary | ICD-10-CM

## 2024-09-05 DIAGNOSIS — Z1211 Encounter for screening for malignant neoplasm of colon: Secondary | ICD-10-CM

## 2024-09-05 DIAGNOSIS — E038 Other specified hypothyroidism: Secondary | ICD-10-CM | POA: Diagnosis not present

## 2024-09-05 DIAGNOSIS — R7301 Impaired fasting glucose: Secondary | ICD-10-CM | POA: Diagnosis not present

## 2024-09-05 DIAGNOSIS — E782 Mixed hyperlipidemia: Secondary | ICD-10-CM | POA: Diagnosis not present

## 2024-09-05 MED ORDER — PRAVASTATIN SODIUM 40 MG PO TABS: 40.0000 mg | ORAL_TABLET | Freq: Every day | ORAL | 1 refills | Status: DC

## 2024-09-05 NOTE — Patient Instructions (Addendum)
 I appreciate the opportunity to provide care to you today!    Follow up:  4 months  Fasting Labs: please stop by the lab today to get your blood drawn (CBC, CMP, TSH, Lipid profile, HgA1c, Vit D), PTH)  For a Healthier YOU, I Recommend: Reducing your intake of sugar, sodium, carbohydrates, and saturated fats. Increasing your fiber intake by incorporating more whole grains, fruits, and vegetables into your meals. Setting healthy goals with a focus on lowering your consumption of carbs, sugar, and unhealthy fats. Adding variety to your diet by including a wide range of fruits and vegetables. Cutting back on soda and limiting processed foods as much as possible. Staying active: In addition to taking your weight loss medication, aim for at least 150 minutes of moderate-intensity physical activity each week for optimal results.    Please follow up if your symptoms worsen or fail to improve.   Please continue to a heart-healthy diet and increase your physical activities. Try to exercise for at least five days a week.    It was a pleasure to see you and I look forward to continuing to work together on your health and well-being. Please do not hesitate to call the office if you need care or have questions about your care.  In case of emergency, please visit the Emergency Department for urgent care, or contact our clinic at [phone] to schedule an appointment. We're here to help you!   Have a wonderful day and week. With Gratitude, Meade JENEANE Gerlach MSN, FNP-BC, PMHNP-BC

## 2024-09-05 NOTE — Assessment & Plan Note (Signed)
 Stable  No changes to treatment regimen

## 2024-09-05 NOTE — Assessment & Plan Note (Signed)
 Pending labs

## 2024-09-05 NOTE — Assessment & Plan Note (Signed)
Encouraged to continue taking Protonix 40 mg daily GERD diet encouraged

## 2024-09-05 NOTE — Progress Notes (Signed)
 Established Patient Office Visit  Subjective:  Patient ID: [name], female    DOB: 03-15-1961  Age: 63 y.o. MRN: [id number]  CC:  Chief Complaint  Patient presents with   Medical Management of Chronic Issues    4 month follow up     HPI [name] is a 63 y.o. female with past medical history of  COPD, GERD, lymphedema presents for f/u of  chronic medical conditions.  For the details of today's visit, please refer to the assessment and plan.     Past Medical History:  Diagnosis Date   Adenocarcinoma of left breast (HCC)    double mastectomy 1990 then 2002    Anemia    Arthritis    rheumatoid   Asthma    BRCA1 positive, suspected 06/10/2014   Mother is BRCA1 +, sister died at 48 from breast cancer, another sister had breast cancer too.   Breast cancer (HCC)    double mastectomy 1990 then 2002   Chronic acquired lymphedema    COPD (chronic obstructive pulmonary disease) (HCC)    Family history of breast cancer    Family history of ovarian cancer    Family history of pancreatic cancer    High cholesterol    Infiltrating ductal carcinoma of right breast (HCC) 06/10/2014   Personal history of malignant neoplasm of breast    S/P bilateral salpingo-oophorectomy 06/10/2014   August 2010    Past Surgical History:  Procedure Laterality Date   ABDOMINAL HYSTERECTOMY     complete hysterectomy   BREAST SURGERY     double mastectomy     MASTECTOMY      Family History  Problem Relation Age of Onset   Breast cancer Mother        dx < 81   Breast cancer Sister 29   Ovarian cancer Sister 47   Breast cancer Sister        d. 38   Breast cancer Maternal Uncle    Breast cancer Maternal Grandmother        dx < 50   Pancreatic cancer Cousin        mat first cousin: reportedly BRCA1 pos    Social History   Socioeconomic History   Marital status: Married    Spouse name: Not on file   Number of children: Not on file   Years of education: Not on file   Highest  education level: 12th grade  Occupational History   Not on file  Tobacco Use   Smoking status: Never    Passive exposure: Never   Smokeless tobacco: Never  Vaping Use   Vaping status: Never Used  Substance and Sexual Activity   Alcohol use: No   Drug use: No   Sexual activity: Yes    Birth control/protection: Surgical  Other Topics Concern   Not on file  Social History Narrative   Not on file   Social Drivers of Health   Financial Resource Strain: Low Risk  (04/30/2024)   Overall Financial Resource Strain (CARDIA)    Difficulty of Paying Living Expenses: Not very hard  Food Insecurity: No Food Insecurity (04/30/2024)   Hunger Vital Sign    Worried About Running Out of Food in the Last Year: Never true    Ran Out of Food in the Last Year: Never true  Transportation Needs: No Transportation Needs (04/30/2024)   PRAPARE - Administrator, Civil Service (Medical): No    Lack of Transportation (  Non-Medical): No  Physical Activity: Sufficiently Active (04/30/2024)   Exercise Vital Sign    Days of Exercise per Week: 5 days    Minutes of Exercise per Session: 30 min  Stress: No Stress Concern Present (04/30/2024)   [employer] of Occupational Health - Occupational Stress Questionnaire    Feeling of Stress : Not at all  Social Connections: Unknown (04/30/2024)   Social Connection and Isolation Panel    Frequency of Communication with Friends and Family: Patient declined    Frequency of Social Gatherings with Friends and Family: Patient declined    Attends Religious Services: More than 4 times per year    Active Member of [employer] or Organizations: No    Attends Engineer, structural: Not on file    Marital Status: Married  Catering manager Violence: Not on file    Outpatient Medications Prior to Visit  Medication Sig Dispense Refill   adalimumab  (HUMIRA , 2 PEN,) 40 MG/0.4ML pen Inject 0.4 mLs (40 mg total) into the skin every 14 (fourteen) days. 1 kit - 2 pens 2.4  mL 0   albuterol  (ACCUNEB ) 0.63 MG/3ML nebulizer solution Take 1 ampule by nebulization 2 (two) times daily.     albuterol  (VENTOLIN  HFA) 108 (90 Base) MCG/ACT inhaler USE 2 INHALATIONS BY MOUTH EVERY 4 HOURS AS NEEDED 54 g 6   Cyanocobalamin (B-12) 1000 MCG TABS Take by mouth.     Ferrous Gluconate (IRON 27 PO) Take 65 mg by mouth every other day.     fluticasone (FLONASE) 50 MCG/ACT nasal spray Place into both nostrils.     furosemide  (LASIX ) 40 MG tablet TAKE 1 TABLET BY MOUTH DAILY 60 tablet 5   hydrochlorothiazide  (HYDRODIURIL ) 25 MG tablet TAKE 1 TABLET BY MOUTH DAILY 100 tablet 2   naproxen sodium (ALEVE) 220 MG tablet Take 220 mg by mouth.     Oxymetazoline HCl (NASAL SPRAY NA) Place into the nose. otc     pantoprazole  (PROTONIX ) 40 MG tablet TAKE 1 TABLET BY MOUTH DAILY 100 tablet 2   RESTASIS  0.05 % ophthalmic emulsion Place 1 drop into both eyes 2 (two) times daily. 0.4 mL 3   VITAMIN D  PO Take by mouth.     pravastatin  (PRAVACHOL ) 40 MG tablet Take 1 tablet (40 mg total) by mouth daily. 60 tablet 1   predniSONE  (DELTASONE ) 5 MG tablet TAKE 2 TABLETS BY MOUTH DAILY  WITH BREAKFAST 60 tablet 0   No facility-administered medications prior to visit.    No Known Allergies  ROS Review of Systems  Constitutional:  Negative for chills and fever.  Eyes:  Negative for visual disturbance.  Respiratory:  Negative for chest tightness and shortness of breath.   Neurological:  Negative for dizziness and headaches.      Objective:    Physical Exam HENT:     Head: Normocephalic.     Mouth/Throat:     Mouth: Mucous membranes are moist.  Cardiovascular:     Rate and Rhythm: Normal rate.     Heart sounds: Normal heart sounds.  Pulmonary:     Effort: Pulmonary effort is normal.     Breath sounds: Normal breath sounds.  Neurological:     Mental Status: She is alert.     BP 122/76   Pulse 68   Resp 18   Ht 5' 1 (1.549 m)   SpO2 95%   BMI 35.71 kg/m  Wt Readings from Last  3 Encounters:  01/04/24 189 lb (85.7 kg)  01/01/24 193 lb 1.9 oz (87.6 kg)  11/03/23 189 lb (85.7 kg)    Lab Results  Component Value Date   TSH 3.060 01/01/2024   Lab Results  Component Value Date   WBC 4.1 07/15/2024   HGB 13.9 07/15/2024   HCT 45.1 (H) 07/15/2024   MCV 80.2 07/15/2024   PLT 201 07/15/2024   Lab Results  Component Value Date   NA 138 07/15/2024   K 4.7 07/15/2024   CO2 29 07/15/2024   GLUCOSE 103 (H) 07/15/2024   BUN 13 07/15/2024   CREATININE 0.62 07/15/2024   BILITOT 0.7 07/15/2024   ALKPHOS 125 (H) 01/01/2024   AST 34 07/15/2024   ALT 52 (H) 07/15/2024   PROT 7.3 07/15/2024   ALBUMIN 4.0 01/01/2024   CALCIUM 11.2 (H) 07/15/2024   ANIONGAP 7 07/22/2020   EGFR 99 07/15/2024   Lab Results  Component Value Date   CHOL 167 01/01/2024   Lab Results  Component Value Date   HDL 70 01/01/2024   Lab Results  Component Value Date   LDLCALC 83 01/01/2024   Lab Results  Component Value Date   TRIG 71 01/01/2024   Lab Results  Component Value Date   CHOLHDL 2.4 01/01/2024   Lab Results  Component Value Date   HGBA1C 6.2 (H) 01/01/2024      Assessment & Plan:  Gastroesophageal reflux disease without esophagitis Assessment & Plan: Encouraged to continue taking Protonix  40 mg daily GERD diet encouraged   Chronic bronchitis, unspecified chronic bronchitis type (HCC) Assessment & Plan: Stable  No changes to treatment regimen   Hypercalcemia Assessment & Plan: Pending labs  Orders: -     Parathyroid  hormone, intact (no Ca)  Other hyperlipidemia -     Pravastatin  Sodium; Take 1 tablet (40 mg total) by mouth daily.  Dispense: 60 tablet; Refill: 1  IFG (impaired fasting glucose) -     Hemoglobin A1c  Vitamin D  deficiency -     VITAMIN D  25 Hydroxy (Vit-D Deficiency, Fractures)  TSH (thyroid -stimulating hormone deficiency) -     TSH + free T4  Mixed hyperlipidemia -     Lipid panel -     CMP14+EGFR -     CBC with  Differential/Platelet  Colon cancer screening -     Cologuard  Note: This chart has been completed using Engineer, civil (consulting) software, and while attempts have been made to ensure accuracy, certain words and phrases may not be transcribed as intended.    Follow-up: Return in about 4 months (around 01/06/2025).   Sasha Rueth  Z Bacchus, FNP

## 2024-09-06 ENCOUNTER — Ambulatory Visit: Payer: Self-pay | Admitting: Family Medicine

## 2024-09-06 LAB — HEMOGLOBIN A1C
Est. average glucose Bld gHb Est-mCnc: 123 mg/dL
Hgb A1c MFr Bld: 5.9 % — ABNORMAL HIGH (ref 4.8–5.6)

## 2024-09-06 LAB — VITAMIN D 25 HYDROXY (VIT D DEFICIENCY, FRACTURES): Vit D, 25-Hydroxy: 25.4 ng/mL — ABNORMAL LOW (ref 30.0–100.0)

## 2024-09-06 LAB — CBC WITH DIFFERENTIAL/PLATELET
Basophils Absolute: 0.1 x10E3/uL (ref 0.0–0.2)
Basos: 1 %
EOS (ABSOLUTE): 0.2 x10E3/uL (ref 0.0–0.4)
Eos: 4 %
Hematocrit: 41.9 % (ref 34.0–46.6)
Hemoglobin: 12.9 g/dL (ref 11.1–15.9)
Immature Grans (Abs): 0 x10E3/uL (ref 0.0–0.1)
Immature Granulocytes: 0 %
Lymphocytes Absolute: 1.8 x10E3/uL (ref 0.7–3.1)
Lymphs: 42 %
MCH: 24.5 pg — ABNORMAL LOW (ref 26.6–33.0)
MCHC: 30.8 g/dL — ABNORMAL LOW (ref 31.5–35.7)
MCV: 80 fL (ref 79–97)
Monocytes Absolute: 0.5 x10E3/uL (ref 0.1–0.9)
Monocytes: 13 %
Neutrophils Absolute: 1.7 x10E3/uL (ref 1.4–7.0)
Neutrophils: 40 %
Platelets: 201 x10E3/uL (ref 150–450)
RBC: 5.27 x10E6/uL (ref 3.77–5.28)
RDW: 16.4 % — ABNORMAL HIGH (ref 11.7–15.4)
WBC: 4.3 x10E3/uL (ref 3.4–10.8)

## 2024-09-06 LAB — CMP14+EGFR
ALT: 38 IU/L — ABNORMAL HIGH (ref 0–32)
AST: 32 IU/L (ref 0–40)
Albumin: 3.9 g/dL (ref 3.9–4.9)
Alkaline Phosphatase: 125 IU/L (ref 49–135)
BUN/Creatinine Ratio: 17 (ref 12–28)
BUN: 11 mg/dL (ref 8–27)
Bilirubin Total: 0.6 mg/dL (ref 0.0–1.2)
CO2: 22 mmol/L (ref 20–29)
Calcium: 10.8 mg/dL — ABNORMAL HIGH (ref 8.7–10.3)
Chloride: 106 mmol/L (ref 96–106)
Creatinine, Ser: 0.66 mg/dL (ref 0.57–1.00)
Globulin, Total: 3 g/dL (ref 1.5–4.5)
Glucose: 93 mg/dL (ref 70–99)
Potassium: 4.5 mmol/L (ref 3.5–5.2)
Sodium: 141 mmol/L (ref 134–144)
Total Protein: 6.9 g/dL (ref 6.0–8.5)
eGFR: 97 mL/min/1.73 (ref >59)

## 2024-09-06 LAB — LIPID PANEL
Chol/HDL Ratio: 2.4 ratio (ref 0.0–4.4)
Cholesterol, Total: 179 mg/dL (ref 100–199)
HDL: 75 mg/dL (ref >39)
LDL Chol Calc (NIH): 89 mg/dL (ref 0–99)
Triglycerides: 80 mg/dL (ref 0–149)
VLDL Cholesterol Cal: 15 mg/dL (ref 5–40)

## 2024-09-06 LAB — TSH+FREE T4
Free T4: 0.83 ng/dL (ref 0.82–1.77)
TSH: 3.48 u[IU]/mL (ref 0.450–4.500)

## 2024-09-16 DIAGNOSIS — Z1211 Encounter for screening for malignant neoplasm of colon: Secondary | ICD-10-CM | POA: Diagnosis not present

## 2024-09-21 LAB — COLOGUARD: COLOGUARD: POSITIVE — AB

## 2024-09-22 ENCOUNTER — Other Ambulatory Visit: Payer: Self-pay | Admitting: Family Medicine

## 2024-09-22 DIAGNOSIS — R195 Other fecal abnormalities: Secondary | ICD-10-CM

## 2024-09-24 ENCOUNTER — Other Ambulatory Visit: Payer: Self-pay | Admitting: Internal Medicine

## 2024-09-24 NOTE — Telephone Encounter (Signed)
 Last Fill: 06/06/2024  Next Visit: 10/15/2024  Last Visit: 07/15/2024  Dx: Rheumatoid arthritis involving multiple sites, unspecified whether rheumatoid factor present    Current Dose per office note on 07/15/2024:  Prednisone  5 mg daily as needed   Okay to refill Prednisone ?

## 2024-09-25 ENCOUNTER — Encounter (INDEPENDENT_AMBULATORY_CARE_PROVIDER_SITE_OTHER): Payer: Self-pay | Admitting: *Deleted

## 2024-10-01 NOTE — Progress Notes (Signed)
 Office Visit Note  Patient: [name]             Date of Birth: 09/15/1961           MRN: [id number]             PCP: Edman Meade PEDLAR, FNP Referring: Edman Meade PEDLAR, FNP Visit Date: 10/15/2024   Subjective:   Discussed the use of AI scribe software for clinical note transcription with the patient, who gave verbal consent to proceed.  History of Present Illness   [name] is a 63 y.o. female here for follow up with rheumatoid arthritis on Humira  40 mg every 14 days. She has decreased her prednisone  use just as needed, estimating about once weekly.  She experiences ongoing neck pain and stiffness, with an inability to turn her neck fully, particularly when driving. The pain is localized to the neck without radiation. Morning stiffness affects her feet and causes soreness in her back.  She has not experienced any issues with her injections. Recent blood work showed improvement, with her previously low white blood cell count returning to normal. She was advised to resume B12 and vitamin D  supplements due to low levels.  She describes a sensation of tightness and restriction across her chest. Her arms feel heavy. She has been attempting to improve her posture by using pillows for support and performing exercises recommended during her hand therapy.  No recent illnesses but reports persistent swelling. She experiences stiffness in the morning, particularly affecting her feet and back.      Previous HPI 07/15/2024 [name] is a 63 y.o. female here for follow up with rheumatoid arthritis on Humira  40 mg every 14 days and prednisone  10 mg daily.     She has been receiving [employer] injections since June and reports she has not had a real bad flare up like she was having before. She uses prednisone  as needed, approximately two days at a time, three times a month, and notes that most days her symptoms are manageable without medication.   In July, her lab results showed a  decreased white blood cell count, specifically neutrophils, at 1200. Her calcium levels have been consistently high, with a recent measurement of 11.6. She has reduced her calcium intake but is unsure why the levels remain elevated. She denies taking vitamin D  or calcium supplements currently.   She experiences swelling in her legs, which she attributes to lymphedema, and wonders if it could be related to Humira , as she read that it can cause swelling. Elevating her legs at night reduces the swelling by morning, though she experiences numbness when sitting with her legs elevated for extended periods.   She is currently taking iron supplements, initially prescribed by another doctor, but is unsure if she still needs them as her blood count is fine. She has not been sick since her last visit.         Previous HPI 04/02/2024 [name] is a 63 y.o. female here for follow up with rheumatoid arthritis on prednisone  10 mg daily.     She has a history of rheumatoid arthritis and was previously on methotrexate , which was discontinued in February due to elevated liver enzymes. Her liver enzymes returned to normal levels by March. Since stopping methotrexate , she has been managing her rheumatoid arthritis with prednisone , initially at 5 mg, but increased to 10 mg daily in March due to worsening symptoms.   In February, she experienced significant joint  pain, particularly in her hands and shoulders, describing it as severe enough that she 'couldn't close my hands' and 'couldn't raise my arm.' By April, her hand pain had improved but was still present.   Her joint pain worsened after stopping methotrexate , but she has had similar flare-ups in the past.   She had a new bone density test that looked good.  Bone density was in osteopenic range at her lowest areas in the total femur and wrist and in the normal range at the vertebra.  Total FRAX was low risk after accounting for RA and glucocorticoid use.      Previous HPI 01/04/2024 [name] is a 63 year old female with rheumatoid arthritis on methotrexate  15 mg PO weekly and folic acid 1 mg daily and prednisone  5 mg daily. She presents with concerns about medication side effects and elevated calcium levels.   She is currently managed with methotrexate  and low-dose prednisone  for rheumatoid arthritis. She is concerned about recent blood tests showing slightly abnormal liver enzyme levels, which she worries might be due to methotrexate , known to affect the liver.   Recent blood tests revealed elevated calcium levels at 11.3 mg/dL, above the normal range. She is unsure of the cause but mentions taking a vitamin D  supplement that may contain calcium. She has not experienced kidney stones recently, although she had one in the past.   Regarding her rheumatoid arthritis, she has antibodies for the condition, but her inflammation markers were stable. She experiences occasional swelling in her fingers, specifically the left second and third PIP joints, which have decreased since last week. She also reports intermittent pain and stiffness in her right shoulder, particularly when raising her arm, which she attributes to calcification seen on her x-rays. Her left shoulder appears normal on x-rays, with no significant osteoarthritis or joint damage noted.      Previous HPI 11/03/23 [name] is a 63 y.o. female here to establish care for rheumatoid arthritis. She has been on methotrexate  20 mg PO weekly and prednisone  5 mg daily managed through her PCP office but with persistent symptoms involving both hands and has been unable to taper or discontinue steroid use. The patient has been experiencing these symptoms for over a decade, with the onset around 2009-2013. The pain initially presented in the legs, impairing mobility, and later spread to the hands, causing difficulty in closing them, and the shoulders, limiting arm movement. The patient also reported  difficulty opening their mouth due to jaw pain. She was evaluated apparently by exam and xrays indicated rheumatoid arthritis. She started on treatment initially with methotrexate  and with intermittent prednisone  tapers. The patient has been on methotrexate  for approximately ten years. She also takes aleve intermittently for pain as needed.   In October, the patient experienced a severe flare-up, with significant hand swelling and pain that lasted for about a week and a half. This flare-up also affected the patient's shoulders and arms, causing them to become hot and swollen. The patient also reported a burning, tingling sensation in the arms lasting for several hours.   The patient has a history of breast cancer treated with chemotherapy, radiation, and mastectomy, which has led to numbness in the tips of the fingers and toes. The patient also has lymphedema, which has caused swelling in the arms. The patient manages this with a body suit for compression and circulation.   Review of Systems  Constitutional:  Negative for fatigue.  HENT:  Negative for mouth sores  and mouth dryness.   Eyes:  Negative for dryness.  Respiratory:  Negative for shortness of breath.   Cardiovascular:  Negative for chest pain and palpitations.  Gastrointestinal:  Positive for blood in stool. Negative for constipation and diarrhea.  Endocrine: Negative for increased urination.  Genitourinary:  Negative for involuntary urination.  Musculoskeletal:  Positive for joint pain, joint pain, joint swelling, myalgias, muscle weakness, morning stiffness, muscle tenderness and myalgias. Negative for gait problem.  Skin:  Negative for color change, rash, hair loss and sensitivity to sunlight.  Allergic/Immunologic: Negative for susceptible to infections.  Neurological:  Negative for dizziness and headaches.  Hematological:  Negative for swollen glands.  Psychiatric/Behavioral:  Negative for depressed mood and sleep disturbance. The  patient is not nervous/anxious.     PMFS History:  Patient Active Problem List   Diagnosis Date Noted   Positive colorectal cancer screening using Cologuard test 10/18/2024   Melena 10/18/2024   Hepatitis B core antibody positive 10/18/2024   Hypercalcemia 09/05/2024   Pedal edema 07/15/2024   Pain in right shoulder 01/04/2024   Osteoporosis screening 01/04/2024   GERD (gastroesophageal reflux disease) 01/01/2024   High risk medication use 11/03/2023   Genetic testing 01/27/2022   Family history of breast cancer 01/06/2022   Family history of ovarian cancer 01/06/2022   Family history of pancreatic cancer 01/06/2022   Personal history of malignant neoplasm of breast 01/06/2022   Infiltrating ductal carcinoma of right breast (HCC) 06/10/2014   BRCA1 positive, suspected 06/10/2014   S/P bilateral salpingo-oophorectomy 06/10/2014   Cellulitis 03/26/2014   Nausea and vomiting 03/26/2014   Cellulitis of left arm 03/26/2014   Lymphedema 03/26/2014   COPD (chronic obstructive pulmonary disease) (HCC)    Rheumatoid arthritis involving multiple sites (HCC)    Anemia    High cholesterol    Adenocarcinoma of left breast (HCC)     Past Medical History:  Diagnosis Date   Adenocarcinoma of left breast (HCC)    double mastectomy 1990 then 2002    Anemia    Arthritis    rheumatoid   Asthma    BRCA1 positive, suspected 06/10/2014   Mother is BRCA1 +, sister died at 59 from breast cancer, another sister had breast cancer too.   Breast cancer (HCC)    double mastectomy 1990 then 2002   Chronic acquired lymphedema    COPD (chronic obstructive pulmonary disease) (HCC)    Family history of breast cancer    Family history of ovarian cancer    Family history of pancreatic cancer    High cholesterol    Infiltrating ductal carcinoma of right breast (HCC) 06/10/2014   Personal history of malignant neoplasm of breast    S/P bilateral salpingo-oophorectomy 06/10/2014   August 2010     Family History  Problem Relation Age of Onset   Breast cancer Mother        dx < 59   Breast cancer Sister 73   Ovarian cancer Sister 37   Breast cancer Sister        d. 55   Breast cancer Maternal Uncle    Breast cancer Maternal Grandmother        dx < 50   Pancreatic cancer Cousin        mat first cousin: reportedly BRCA1 pos   Past Surgical History:  Procedure Laterality Date   ABDOMINAL HYSTERECTOMY     complete hysterectomy   BREAST SURGERY     double mastectomy     MASTECTOMY  Social History   Social History Narrative   Not on file   Immunization History  Administered Date(s) Administered   Zoster Recombinant(Shingrix) 04/08/2022, 10/04/2022     Objective: Vital Signs: BP 134/69   Pulse 64   Temp (!) 97.4 F (36.3 C)   Resp 16   Ht 5' 1 (1.549 m)   Wt 185 lb (83.9 kg) Comment: provided by patient due to lymphodema pt does not weigh in office.  BMI 34.96 kg/m    Physical Exam Constitutional:      Appearance: She is obese.  Eyes:     Conjunctiva/sclera: Conjunctivae normal.  Cardiovascular:     Rate and Rhythm: Normal rate and regular rhythm.  Pulmonary:     Effort: Pulmonary effort is normal.     Breath sounds: Normal breath sounds.  Lymphadenopathy:     Cervical: No cervical adenopathy.  Skin:    General: Skin is warm and dry.     Comments: B/l arms lymphedema  Neurological:     Mental Status: She is alert.  Psychiatric:        Mood and Affect: Mood normal.      Musculoskeletal Exam:  Shoulders full ROM some guarding overhead movement, no swelling, mild tenderness to pressure also over traps Elbows full ROM no tenderness or swelling Wrists full ROM no tenderness or swelling Fingers full ROM no palpable synovitis or tenderness Knees full ROM no tenderness or swelling    Investigation: No additional findings.  Imaging: No results found.  Recent Labs: Lab Results  Component Value Date   WBC 4.3 09/05/2024   HGB 12.9  09/05/2024   PLT 201 09/05/2024   NA 141 09/05/2024   K 4.5 09/05/2024   CL 106 09/05/2024   CO2 22 09/05/2024   GLUCOSE 93 09/05/2024   BUN 11 09/05/2024   CREATININE 0.66 09/05/2024   BILITOT 0.6 09/05/2024   ALKPHOS 125 09/05/2024   AST 32 09/05/2024   ALT 38 (H) 09/05/2024   PROT 6.9 09/05/2024   ALBUMIN 3.9 09/05/2024   CALCIUM 10.8 (H) 09/05/2024   GFRAA >60 07/22/2020   QFTBGOLDPLUS NEGATIVE 11/03/2023    Speciality Comments: No specialty comments available.  Procedures:  No procedures performed Allergies: Patient has no known allergies.   Assessment / Plan:     Visit Diagnoses: Rheumatoid arthritis involving multiple sites, unspecified whether rheumatoid factor present (HCC) - Plan: adalimumab  (HUMIRA , 2 PEN,) 40 MG/0.4ML pen Condition well-controlled with biweekly injections and prednisone  at just PRN/estimated once per week low risk for maintenance. Normalized white blood cell count. - Continue Humira  40 mg subcu q. 14 days - Prednisone  5 mg daily PRN  High risk medication use - Humira  40 mg every 14 days - Plan: adalimumab  (HUMIRA , 2 PEN,) 40 MG/0.4ML pen Subsequent recent labs with WBCs improved after initial neutropenia. No serious interval infections. Reviewed recent CBC, CMP persistent mildly high ALT and calcium. Mildly high PTH maybe primary, less likely vitamin D  related with high calcium.  Musculoskeletal neck and upper back pain (muscular) Chronic pain due to muscular issues, exacerbated by posture and post-surgical changes. Likely tightness in levator scapula and upper trapezius. Could consider trial of subacromial injections next time if symptoms not improving. - Provided handout with neck and range of motion exercises. - Recommended mid-back muscle engagement and neck mobility exercises. - Advised on levator scapula stretching exercises.    Orders: No orders of the defined types were placed in this encounter.  Meds ordered this encounter  Medications   adalimumab  (HUMIRA , 2 PEN,) 40 MG/0.4ML pen    Sig: Inject 0.4 mLs (40 mg total) into the skin every 14 (fourteen) days. 1 kit - 2 pens    Dispense:  2.4 mL    Refill:  0     Follow-Up Instructions: Return in about 3 months (around 01/15/2025).   Lonni LELON Ester, MD  Note - This record has been created using [employer].  Chart creation errors have been sought, but may not always  have been located. Such creation errors do not reflect on  the standard of medical care.

## 2024-10-08 ENCOUNTER — Other Ambulatory Visit: Payer: Self-pay

## 2024-10-08 NOTE — Telephone Encounter (Signed)
 Opened in error

## 2024-10-14 ENCOUNTER — Telehealth: Payer: Self-pay

## 2024-10-14 NOTE — Telephone Encounter (Signed)
 Copied from CRM [id number]. Topic: Clinical - Prescription Issue >> Oct 14, 2024  1:11 PM Selinda RAMAN wrote: Reason for CRM: The patient called in stating her previous prescription for special bras she gets from [facility] due to breast removal surgery has run out. She got her previous prescription from her last provider Dr Nemiah who has since retired. She is requesting a prescription be sent to [facility] if possible. Please contact patient with any questions.

## 2024-10-15 ENCOUNTER — Ambulatory Visit: Attending: Internal Medicine | Admitting: Internal Medicine

## 2024-10-15 ENCOUNTER — Encounter: Payer: Self-pay | Admitting: Internal Medicine

## 2024-10-15 VITALS — BP 134/69 | HR 64 | Temp 97.4°F | Resp 16 | Ht 61.0 in | Wt 185.0 lb

## 2024-10-15 DIAGNOSIS — Z79899 Other long term (current) drug therapy: Secondary | ICD-10-CM | POA: Diagnosis not present

## 2024-10-15 DIAGNOSIS — M069 Rheumatoid arthritis, unspecified: Secondary | ICD-10-CM

## 2024-10-15 DIAGNOSIS — Z7952 Long term (current) use of systemic steroids: Secondary | ICD-10-CM

## 2024-10-15 DIAGNOSIS — Z1159 Encounter for screening for other viral diseases: Secondary | ICD-10-CM

## 2024-10-15 MED ORDER — HUMIRA (2 PEN) 40 MG/0.4ML ~~LOC~~ AJKT: 40.0000 mg | AUTO-INJECTOR | SUBCUTANEOUS | 0 refills | Status: AC

## 2024-10-18 ENCOUNTER — Ambulatory Visit (INDEPENDENT_AMBULATORY_CARE_PROVIDER_SITE_OTHER): Admitting: Gastroenterology

## 2024-10-18 ENCOUNTER — Encounter (INDEPENDENT_AMBULATORY_CARE_PROVIDER_SITE_OTHER): Payer: Self-pay | Admitting: Gastroenterology

## 2024-10-18 ENCOUNTER — Other Ambulatory Visit: Payer: Self-pay

## 2024-10-18 ENCOUNTER — Telehealth: Payer: Self-pay

## 2024-10-18 VITALS — BP 127/80 | HR 65 | Temp 97.1°F | Ht 61.0 in

## 2024-10-18 DIAGNOSIS — D500 Iron deficiency anemia secondary to blood loss (chronic): Secondary | ICD-10-CM

## 2024-10-18 DIAGNOSIS — R7689 Other specified abnormal immunological findings in serum: Secondary | ICD-10-CM | POA: Diagnosis not present

## 2024-10-18 DIAGNOSIS — K921 Melena: Secondary | ICD-10-CM | POA: Insufficient documentation

## 2024-10-18 DIAGNOSIS — C50912 Malignant neoplasm of unspecified site of left female breast: Secondary | ICD-10-CM

## 2024-10-18 DIAGNOSIS — R195 Other fecal abnormalities: Secondary | ICD-10-CM | POA: Insufficient documentation

## 2024-10-18 MED ORDER — UNABLE TO FIND: 1.0000 | Freq: Every day | 0 refills | Status: AC

## 2024-10-18 NOTE — Progress Notes (Signed)
 Rosey Eide Faizan Tere Mcconaughey , M.D. Gastroenterology & Hepatology [facility] [address] [place], [place] [address] Primary Care Physician: Edman Meade PEDLAR, FNP [address] [place] [address]  Chief Complaint: Cologuard test positive, iron deficiency anemia  History of Present Illness: [name] is a 63 y.o. female with rheumatoid arthritis on Humira  and prednisone , breast cancer COPD and GERD who presents for evaluation of Cologuard test positive, iron deficiency anemia  Patient reports that occasionally she will notice her stools black in color.  She has been on iron at least since 2015 when she was told she has iron deficiency.  Patient occasionally takes NSAIDs (naproxen) The patient denies having any nausea, vomiting, fever, chills,  hematemesis, abdominal distention, abdominal pain, diarrhea, jaundice, pruritus or weight loss.  Last ZHI:wnwz Last Colonoscopy:age 56 at OSH   FHx: Strong family history of breast and ovarian cancer, as per patient genetic testing was done which was negative  social: neg smoking, alcohol or illicit drug use  Labs from 22,025 hemoglobin 12.9 MCV 80 platelet 201  Hemoglobin A1c 5.9  Hep B core IgM reactive hep B surface antigen negative Past Medical History: Past Medical History:  Diagnosis Date   Adenocarcinoma of left breast (HCC)    double mastectomy 1990 then 2002    Anemia    Arthritis    rheumatoid   Asthma    BRCA1 positive, suspected 06/10/2014   Mother is BRCA1 +, sister died at 54 from breast cancer, another sister had breast cancer too.   Breast cancer (HCC)    double mastectomy 1990 then 2002   Chronic acquired lymphedema    COPD (chronic obstructive pulmonary disease) (HCC)    Family history of breast cancer    Family history of ovarian cancer    Family history of pancreatic cancer    High cholesterol    Infiltrating ductal carcinoma of right breast (HCC) 06/10/2014    Personal history of malignant neoplasm of breast    S/P bilateral salpingo-oophorectomy 06/10/2014   August 2010    Past Surgical History: Past Surgical History:  Procedure Laterality Date   ABDOMINAL HYSTERECTOMY     complete hysterectomy   BREAST SURGERY     double mastectomy     MASTECTOMY      Family History: Family History  Problem Relation Age of Onset   Breast cancer Mother        dx < 71   Breast cancer Sister 82   Ovarian cancer Sister 82   Breast cancer Sister        d. 84   Breast cancer Maternal Uncle    Breast cancer Maternal Grandmother        dx < 50   Pancreatic cancer Cousin        mat first cousin: reportedly BRCA1 pos    Social History: Social History   Tobacco Use  Smoking Status Never   Passive exposure: Never  Smokeless Tobacco Never   Social History   Substance and Sexual Activity  Alcohol Use No   Social History   Substance and Sexual Activity  Drug Use No    Allergies: No Known Allergies  Medications: Current Outpatient Medications  Medication Sig Dispense Refill   adalimumab  (HUMIRA , 2 PEN,) 40 MG/0.4ML pen Inject 0.4 mLs (40 mg total) into the skin every 14 (fourteen) days. 1 kit - 2 pens 2.4 mL 0   albuterol  (ACCUNEB ) 0.63 MG/3ML nebulizer solution Take 1 ampule  by nebulization 2 (two) times daily. (Patient taking differently: Take 1 ampule by nebulization 2 (two) times daily. As needed.)     albuterol  (VENTOLIN  HFA) 108 (90 Base) MCG/ACT inhaler USE 2 INHALATIONS BY MOUTH EVERY 4 HOURS AS NEEDED 54 g 6   Cyanocobalamin (B-12) 1000 MCG TABS Take by mouth. (Patient taking differently: Take by mouth daily at 6 (six) AM.)     Ferrous Gluconate (IRON 27 PO) Take 65 mg by mouth every other day. (Patient taking differently: Take 65 mg by mouth daily at 6 (six) AM.)     fluticasone (FLONASE) 50 MCG/ACT nasal spray Place into both nostrils. (Patient taking differently: Place into both nostrils as needed.)     furosemide  (LASIX ) 40 MG  tablet TAKE 1 TABLET BY MOUTH DAILY 60 tablet 5   hydrochlorothiazide  (HYDRODIURIL ) 25 MG tablet TAKE 1 TABLET BY MOUTH DAILY 100 tablet 2   naproxen sodium (ALEVE) 220 MG tablet Take 220 mg by mouth. (Patient taking differently: Take 220 mg by mouth as needed.)     Oxymetazoline HCl (NASAL SPRAY NA) Place into the nose. otc (Patient taking differently: Place into the nose as needed. otc)     pantoprazole  (PROTONIX ) 40 MG tablet TAKE 1 TABLET BY MOUTH DAILY 100 tablet 2   pravastatin  (PRAVACHOL ) 40 MG tablet Take 1 tablet (40 mg total) by mouth daily. 60 tablet 1   predniSONE  (DELTASONE ) 5 MG tablet TAKE 2 TABLETS BY MOUTH DAILY  WITH BREAKFAST 60 tablet 0   RESTASIS  0.05 % ophthalmic emulsion Place 1 drop into both eyes 2 (two) times daily. (Patient taking differently: Place 1 drop into both eyes as needed.) 0.4 mL 3   VITAMIN D  PO Take by mouth.     No current facility-administered medications for this visit.    Review of Systems: GENERAL: negative for malaise, night sweats HEENT: No changes in hearing or vision, no nose bleeds or other nasal problems. NECK: Negative for lumps, goiter, pain and significant neck swelling RESPIRATORY: Negative for cough, wheezing CARDIOVASCULAR: Negative for chest pain, leg swelling, palpitations, orthopnea GI: SEE HPI MUSCULOSKELETAL: Negative for joint pain or swelling, back pain, and muscle pain. SKIN: Negative for lesions, rash HEMATOLOGY Negative for prolonged bleeding, bruising easily, and swollen nodes. ENDOCRINE: Negative for cold or heat intolerance, polyuria, polydipsia and goiter. NEURO: negative for tremor, gait imbalance, syncope and seizures. The remainder of the review of systems is noncontributory.   Physical Exam: BP 127/80 (BP Location: Right Arm, Patient Position: Sitting, Cuff Size: Large)   Pulse 65   Temp (!) 97.1 F (36.2 C) (Temporal)   Ht 5' 1 (1.549 m)   BMI 34.96 kg/m  GENERAL: The patient is AO x3, in no acute  distress. HEENT: Head is normocephalic and atraumatic. EOMI are intact. Mouth is well hydrated and without lesions. NECK: Supple. No masses LUNGS: Clear to auscultation. No presence of rhonchi/wheezing/rales. Adequate chest expansion HEART: RRR, normal s1 and s2. ABDOMEN: Soft, nontender, no guarding, no peritoneal signs, and nondistended. BS +. No masses.   Imaging/Labs: as above     Latest Ref Rng & Units 09/05/2024   10:08 AM 07/15/2024   12:00 AM 05/30/2024   10:06 AM  CBC  WBC 3.4 - 10.8 x10E3/uL 4.3  4.1  3.4   Hemoglobin 11.1 - 15.9 g/dL 87.0  86.0  86.4   Hematocrit 34.0 - 46.6 % 41.9  45.1  44.8   Platelets 150 - 450 x10E3/uL 201  201  196  Lab Results  Component Value Date   IRON 34 (L) 03/27/2014   TIBC 228 (L) 03/27/2014   FERRITIN 560 (H) 03/27/2014    I personally reviewed and interpreted the available labs, imaging and endoscopic files.  Impression and Plan:  [name] is a 63 y.o. female with rheumatoid arthritis on Humira  and prednisone , breast cancer COPD and GERD who presents for evaluation of Cologuard test positive, iron deficiency anemia  #Iron deficiency anemia #Cologuard test positive   Discussed cologuard results in detail, specifically what it means when the test is positive or negative.  Discussed that there is a possibility that even when the test is positive there may not be a polyp found on colonoscopy. More than 50% of the office visit was dedicated to discussing the procedure, including the day of and risks involved. Patient understands what the procedure involves including the benefits and any risks. Patient understands alternatives to the proposed procedure. Risks including (but not limited to) bleeding, tearing of the lining (perforation), rupture of adjacent organs, problems with heart and lung function, infection, and medication reactions. A small percentage of complications may require surgery, hospitalization, repeat endoscopic  procedure, and/or transfusion. A small percentage of polyps and other tumors may not be seen.  - Schedule colonoscopy  Also patient reports chronically being on iron supplementation.  Last iron profile I see in the system is from 2015 with iron saturation 15% (low)   Patient is chronically on prednisone  and also NSAIDs which could lead to peptic ulcer disease or gastritis.  We can evaluate this with upper endoscopy during the same colonoscopy/  Recommend checking iron profile, vitamin B12 and folate with next blood work  #Hep B core IgM positive  Appears to have exposure to hep B but negative surface antigen  Recommend rechecking surface antigen as well is on immunosuppressants  Also in future if patient requires B-cell depleting agents for any reason may recommend prophylaxis against hep B  Wood Novacek Faizan Cameron Schwinn, MD Gastroenterology and Hepatology [facility]   This chart has been completed using [facility] Dictation software, and while attempts have been made to ensure accuracy , certain words and phrases may not be transcribed as intended

## 2024-10-18 NOTE — Telephone Encounter (Signed)
 Rx printed , in [place] folder to sign

## 2024-10-18 NOTE — Telephone Encounter (Signed)
 Pt is needing RX for prosthetic bra sent to [facility]

## 2024-10-18 NOTE — Patient Instructions (Signed)
 It was very nice to meet you today, as dicussed with will plan for the following :  1) upper endoscopy and colonoscopy

## 2024-10-21 ENCOUNTER — Telehealth (INDEPENDENT_AMBULATORY_CARE_PROVIDER_SITE_OTHER): Payer: Self-pay

## 2024-10-21 MED ORDER — PEG 3350-KCL-NA BICARB-NACL 420 G PO SOLR: 4000.0000 mL | Freq: Once | ORAL | 0 refills | Status: AC

## 2024-10-21 NOTE — Telephone Encounter (Signed)
 Spoke with patient, scheduled EGD/TCS for 11/22/2024 at 10:15. Rx sent to pharmacy. Instructions sent to mychart.

## 2024-10-21 NOTE — Telephone Encounter (Signed)
 PA on [facility] for TCS/EGD: Notification or Prior Authorization is not required for the requested services You are not required to submit a notification/prior authorization based on the information provided.  Decision ID #: [id number]

## 2024-10-28 ENCOUNTER — Telehealth: Payer: Self-pay

## 2024-10-28 NOTE — Telephone Encounter (Signed)
 Patient contacted the office and states she has not received her Humira . Advised the patient the medication was sent in on 10/15/2024 and to contact the pharmacy. Patient verbalized understanding.

## 2024-11-11 ENCOUNTER — Telehealth: Payer: Self-pay | Admitting: Internal Medicine

## 2024-11-11 NOTE — Telephone Encounter (Signed)
 Pt called wanting to speak with the pharmacist about her patient assistance form.

## 2024-11-13 ENCOUNTER — Other Ambulatory Visit: Payer: Self-pay | Admitting: Family Medicine

## 2024-11-13 DIAGNOSIS — E7849 Other hyperlipidemia: Secondary | ICD-10-CM

## 2024-11-14 ENCOUNTER — Telehealth: Payer: Self-pay

## 2024-11-14 NOTE — Telephone Encounter (Signed)
 Pt had reached out regarding renewal of PAP application. Attempted to contact pt to discuss, left VoiceMail requesting a return call. Direct office number provided.  Upon investigation it also seems like her PA has expired, will go ahead and initiate the renewal process for that as well.  Attempted to submit a Prior Authorization request to [employer] for HUMIRA  via CoverMyMeds, however it appears that pt's insurance will no longer cover Humira  and pt must switch to Yuflyma or [employer]. Will proceed with [employer] PA as [employer] does not offer PAP for insured pts.  Key: [id number]

## 2024-11-18 ENCOUNTER — Encounter (HOSPITAL_COMMUNITY): Payer: Self-pay

## 2024-11-18 ENCOUNTER — Encounter (HOSPITAL_COMMUNITY)
Admission: RE | Admit: 2024-11-18 | Discharge: 2024-11-18 | Disposition: A | Discharge status: Home / Self Care | Source: Ambulatory Visit | Attending: Gastroenterology | Admitting: Gastroenterology

## 2024-11-18 ENCOUNTER — Other Ambulatory Visit: Payer: Self-pay

## 2024-11-18 NOTE — Telephone Encounter (Signed)
 Submitted a Prior Authorization request to [employer] for YUFLYMA via CoverMyMeds. Will update once we receive a response.  Key: [id number]

## 2024-11-18 NOTE — Pre-Procedure Instructions (Signed)
 Attempted pre-op phone call. Patient was at [facility] and is going to call me back.

## 2024-11-19 ENCOUNTER — Telehealth (INDEPENDENT_AMBULATORY_CARE_PROVIDER_SITE_OTHER): Payer: Self-pay | Admitting: Gastroenterology

## 2024-11-19 NOTE — Telephone Encounter (Signed)
 Pt called with questions about her procedure. Please call [phone]

## 2024-11-20 ENCOUNTER — Telehealth (INDEPENDENT_AMBULATORY_CARE_PROVIDER_SITE_OTHER): Payer: Self-pay | Admitting: Gastroenterology

## 2024-11-20 ENCOUNTER — Other Ambulatory Visit (HOSPITAL_COMMUNITY): Payer: Self-pay

## 2024-11-20 MED ORDER — PEG 3350-KCL-NA BICARB-NACL 420 G PO SOLR: 4000.0000 mL | Freq: Once | ORAL | 0 refills | Status: AC

## 2024-11-20 NOTE — Telephone Encounter (Signed)
 Patient states that the prep was not at her pharmacy. I have resent the prep. Patient aware and verbalized understanding.

## 2024-11-20 NOTE — Addendum Note (Signed)
 Addended by: DALLIE LIONEL RAMAN on: 11/20/2024 10:23 AM   Modules accepted: Orders

## 2024-11-20 NOTE — Telephone Encounter (Signed)
 Pt has questions about her prep, she is scheduled for Friday 12/26. [phone]

## 2024-11-20 NOTE — Telephone Encounter (Signed)
 Per CMM, PA request for Rosalind has been denied as of 12/22 due to pt not having tried/failed the preferred formulary alternatives (which still currently includes Humira , indicating that the PA requirements have not yet been updated to reflect the formulary changes in 2026). Despite this, we still somehow ultimately received an approval letter for [facility]. The letter is dated 12/20, which implies that this approval was in response to the initial attempt to renew her Humira  approval.  Received notification from [facility] regarding a prior authorization for YUFLYMA. Authorization has been APPROVED from 11/16/2024 to 11/27/2025. Approval letter sent to scan center.  Per test claim, copay for 28 days supply is $490.94  Authorization # [id number]   Will get [facility] paperwork drafted up and mailed out to pt.

## 2024-11-22 ENCOUNTER — Ambulatory Visit (HOSPITAL_COMMUNITY)
Admission: RE | Admit: 2024-11-22 | Discharge: 2024-11-22 | Disposition: A | Discharge status: Home / Self Care | Attending: Gastroenterology | Admitting: Gastroenterology

## 2024-11-22 ENCOUNTER — Other Ambulatory Visit: Payer: Self-pay

## 2024-11-22 ENCOUNTER — Ambulatory Visit (HOSPITAL_COMMUNITY): Payer: Self-pay | Admitting: Certified Registered Nurse Anesthetist

## 2024-11-22 ENCOUNTER — Encounter (HOSPITAL_COMMUNITY)
Admission: RE | Disposition: A | Discharge status: Home / Self Care | Payer: Self-pay | Source: Home / Self Care | Attending: Gastroenterology

## 2024-11-22 DIAGNOSIS — J449 Chronic obstructive pulmonary disease, unspecified: Secondary | ICD-10-CM | POA: Diagnosis not present

## 2024-11-22 DIAGNOSIS — J4489 Other specified chronic obstructive pulmonary disease: Secondary | ICD-10-CM | POA: Diagnosis not present

## 2024-11-22 DIAGNOSIS — D123 Benign neoplasm of transverse colon: Secondary | ICD-10-CM

## 2024-11-22 DIAGNOSIS — K648 Other hemorrhoids: Secondary | ICD-10-CM | POA: Insufficient documentation

## 2024-11-22 DIAGNOSIS — R195 Other fecal abnormalities: Secondary | ICD-10-CM | POA: Insufficient documentation

## 2024-11-22 DIAGNOSIS — B9681 Helicobacter pylori [H. pylori] as the cause of diseases classified elsewhere: Secondary | ICD-10-CM

## 2024-11-22 DIAGNOSIS — I10 Essential (primary) hypertension: Secondary | ICD-10-CM | POA: Insufficient documentation

## 2024-11-22 DIAGNOSIS — D122 Benign neoplasm of ascending colon: Secondary | ICD-10-CM

## 2024-11-22 DIAGNOSIS — D509 Iron deficiency anemia, unspecified: Secondary | ICD-10-CM | POA: Insufficient documentation

## 2024-11-22 DIAGNOSIS — K2950 Unspecified chronic gastritis without bleeding: Secondary | ICD-10-CM | POA: Diagnosis not present

## 2024-11-22 DIAGNOSIS — Z1211 Encounter for screening for malignant neoplasm of colon: Secondary | ICD-10-CM | POA: Insufficient documentation

## 2024-11-22 DIAGNOSIS — K2970 Gastritis, unspecified, without bleeding: Secondary | ICD-10-CM

## 2024-11-22 DIAGNOSIS — K219 Gastro-esophageal reflux disease without esophagitis: Secondary | ICD-10-CM | POA: Diagnosis not present

## 2024-11-22 DIAGNOSIS — K635 Polyp of colon: Secondary | ICD-10-CM | POA: Insufficient documentation

## 2024-11-22 HISTORY — PX: ESOPHAGOGASTRODUODENOSCOPY: SHX5428

## 2024-11-22 HISTORY — PX: COLONOSCOPY: SHX5424

## 2024-11-22 HISTORY — PX: POLYPECTOMY: SHX149

## 2024-11-22 LAB — HM COLONOSCOPY

## 2024-11-22 SURGERY — COLONOSCOPY
Anesthesia: Monitor Anesthesia Care

## 2024-11-22 MED ORDER — LIDOCAINE HCL (CARDIAC) PF 100 MG/5ML IV SOSY
PREFILLED_SYRINGE | INTRAVENOUS | Status: DC | PRN
  Administered 2024-11-22: 100 mg via INTRAVENOUS

## 2024-11-22 MED ORDER — LACTATED RINGERS IV SOLN: INTRAVENOUS | Status: DC

## 2024-11-22 MED ORDER — PROPOFOL 10 MG/ML IV BOLUS
INTRAVENOUS | Status: DC | PRN
  Administered 2024-11-22: 100 mg via INTRAVENOUS
  Administered 2024-11-22: 175 ug/kg/min via INTRAVENOUS

## 2024-11-22 NOTE — Anesthesia Postprocedure Evaluation (Signed)
"   Anesthesia Post Note  Patient: [name]  Procedure(s) Performed: COLONOSCOPY EGD (ESOPHAGOGASTRODUODENOSCOPY) POLYPECTOMY, INTESTINE  Patient location during evaluation: Phase II Anesthesia Type: MAC Level of consciousness: awake Pain management: pain level controlled Vital Signs Assessment: post-procedure vital signs reviewed and stable Respiratory status: spontaneous breathing and respiratory function stable Cardiovascular status: blood pressure returned to baseline and stable Postop Assessment: no headache and no apparent nausea or vomiting Anesthetic complications: no Comments: Late entry   No notable events documented.   Last Vitals:  Vitals:   11/22/24 0854 11/22/24 1111  BP: 129/81 (!) 99/58  Pulse: 64 68  Resp: 16 20  Temp: 36.5 C 36.4 C  SpO2: 100% 98%    Last Pain:  Vitals:   11/22/24 1111  TempSrc: Axillary  PainSc: 0-No pain                 Yvonna PARAS Brentt Fread      "

## 2024-11-22 NOTE — Transfer of Care (Signed)
 Immediate Anesthesia Transfer of Care Note  Patient: [name]  Procedure(s) Performed: COLONOSCOPY EGD (ESOPHAGOGASTRODUODENOSCOPY) POLYPECTOMY, INTESTINE  Patient Location: Short Stay  Anesthesia Type:MAC  Level of Consciousness: drowsy, patient cooperative, and responds to stimulation  Airway & Oxygen  Therapy: Patient Spontanous Breathing  Post-op Assessment: Report given to RN and Post -op Vital signs reviewed and stable  Post vital signs: Reviewed and stable  Last Vitals:  Vitals Value Taken Time  BP 99/58 11/22/24 11:11  Temp 36.4 C 11/22/24 11:11  Pulse 68 11/22/24 11:11  Resp 20 11/22/24 11:11  SpO2 98 % 11/22/24 11:11    Last Pain:  Vitals:   11/22/24 1111  TempSrc: Axillary  PainSc: 0-No pain      Patients Stated Pain Goal: 5 (11/22/24 1111)  Complications: No notable events documented.

## 2024-11-22 NOTE — Anesthesia Preprocedure Evaluation (Signed)
"                                    Anesthesia Evaluation  Patient identified by MRN, date of birth, ID band Patient awake    Reviewed: Allergy & Precautions, H&P , NPO status , Patient's Chart, lab work & pertinent test results, reviewed documented beta blocker date and time   Airway Mallampati: II  TM Distance: >3 FB Neck ROM: full    Dental no notable dental hx.    Pulmonary asthma , COPD   Pulmonary exam normal breath sounds clear to auscultation       Cardiovascular Exercise Tolerance: Good hypertension, negative cardio ROS  Rhythm:regular Rate:Normal     Neuro/Psych negative neurological ROS  negative psych ROS   GI/Hepatic Neg liver ROS,GERD  ,,  Endo/Other  negative endocrine ROS    Renal/GU negative Renal ROS  negative genitourinary   Musculoskeletal   Abdominal   Peds  Hematology  (+) Blood dyscrasia, anemia   Anesthesia Other Findings   Reproductive/Obstetrics negative OB ROS                              Anesthesia Physical Anesthesia Plan  ASA: 3  Anesthesia Plan: MAC   Post-op Pain Management:    Induction:   PONV Risk Score and Plan: Propofol  infusion  Airway Management Planned:   Additional Equipment:   Intra-op Plan:   Post-operative Plan:   Informed Consent: I have reviewed the patients History and Physical, chart, labs and discussed the procedure including the risks, benefits and alternatives for the proposed anesthesia with the patient or authorized representative who has indicated his/her understanding and acceptance.     Dental Advisory Given  Plan Discussed with: CRNA  Anesthesia Plan Comments:         Anesthesia Quick Evaluation  "

## 2024-11-22 NOTE — Op Note (Signed)
 [facility] Patient Name: [name] Procedure Date: 11/22/2024 10:09 AM MRN: [id number] Date of Birth: 1961-05-25 Attending MD: Deatrice Dine , MD, [id number] CSN: [id number] Age: 63 Admit Type: Outpatient Procedure:                Upper GI endoscopy Indications:              Unexplained iron deficiency anemia Providers:                Deatrice Dine, MD, Devere Lodge, Bascom Blush Referring MD:              Medicines:                Monitored Anesthesia Care Complications:            No immediate complications. Estimated Blood Loss:     Estimated blood loss was minimal. Procedure:                Pre-Anesthesia Assessment:                           - Prior to the procedure, a History and Physical                            was performed, and patient medications and                            allergies were reviewed. The patient's tolerance of                            previous anesthesia was also reviewed. The risks                            and benefits of the procedure and the sedation                            options and risks were discussed with the patient.                            All questions were answered, and informed consent                            was obtained. Prior Anticoagulants: The patient has                            taken no anticoagulant or antiplatelet agents                            except for aspirin. ASA Grade Assessment: II - A                            patient with mild systemic disease. After reviewing                            the risks and benefits, the patient was deemed in  satisfactory condition to undergo the procedure.                           After obtaining informed consent, the endoscope was                            passed under direct vision. Throughout the                            procedure, the patient's blood pressure, pulse, and                            oxygen  saturations were monitored  continuously. The                            [id number] ([id number]) Upper was introduced through                            the mouth, and advanced to the second part of                            duodenum. The upper GI endoscopy was accomplished                            without difficulty. The patient tolerated the                            procedure well. Scope In: 10:30:59 AM Scope Out: 10:35:06 AM Total Procedure Duration: 0 hours 4 minutes 7 seconds  Findings:      The examined esophagus was normal.      Diffuse moderate inflammation characterized by erythema was found in the       entire examined stomach. Biopsies were taken with a cold forceps for       histology.      The duodenal bulb and second portion of the duodenum were normal. Impression:               - Normal esophagus.                           - Gastritis. Biopsied.                           - Normal duodenal bulb and second portion of the                            duodenum. Moderate Sedation:      Per Anesthesia Care Recommendation:           - Patient has a contact number available for                            emergencies. The signs and symptoms of potential                            delayed complications were discussed with the  patient. Return to normal activities tomorrow.                            Written discharge instructions were provided to the                            patient.                           - Resume previous diet.                           - Continue present medications.                           - Await pathology results. Procedure Code(s):        --- Professional ---                           [phone], Esophagogastroduodenoscopy, flexible,                            transoral; with biopsy, single or multiple Diagnosis Code(s):        --- Professional ---                           K29.70, Gastritis, unspecified, without bleeding                           D50.9,  Iron deficiency anemia, unspecified CPT copyright 2022 American Medical Association. All rights reserved. The codes documented in this report are preliminary and upon coder review may  be revised to meet current compliance requirements. Deatrice Dine, MD Deatrice Dine, MD 11/22/2024 11:12:54 AM This report has been signed electronically. Number of Addenda: 0

## 2024-11-22 NOTE — Op Note (Signed)
 [facility] Patient Name: [name] Procedure Date: 11/22/2024 10:09 AM MRN: [id number] Date of Birth: 09/20/1961 Attending MD: Deatrice Dine , MD, [id number] CSN: [id number] Age: 63 Admit Type: Outpatient Procedure:                Colonoscopy Indications:              Positive Cologuard test Providers:                Deatrice Dine, MD, Devere Lodge Referring MD:              Medicines:                Monitored Anesthesia Care Complications:            No immediate complications. Estimated Blood Loss:     Estimated blood loss was minimal. Procedure:                Pre-Anesthesia Assessment:                           - Prior to the procedure, a History and Physical                            was performed, and patient medications and                            allergies were reviewed. The patient's tolerance of                            previous anesthesia was also reviewed. The risks                            and benefits of the procedure and the sedation                            options and risks were discussed with the patient.                            All questions were answered, and informed consent                            was obtained. Prior Anticoagulants: The patient has                            taken no anticoagulant or antiplatelet agents. ASA                            Grade Assessment: II - A patient with mild systemic                            disease. After reviewing the risks and benefits,                            the patient was deemed in satisfactory condition to  undergo the procedure.                           After obtaining informed consent, the colonoscope                            was passed under direct vision. Throughout the                            procedure, the patient's blood pressure, pulse, and                            oxygen  saturations were monitored continuously. The                             [id number]-[id number] ([id number]) Peds Colon was introduced                            through the anus and advanced to the the cecum,                            identified by appendiceal orifice and ileocecal                            valve. The colonoscopy was performed without                            difficulty. The patient tolerated the procedure                            well. The quality of the bowel preparation was                            evaluated using the BBPS [facility] Bowel Preparation                            Scale) with scores of: Right Colon = 3, Transverse                            Colon = 3 and Left Colon = 3 (entire mucosa seen                            well with no residual staining, small fragments of                            stool or opaque liquid). The total BBPS score                            equals 9. The ileocecal valve, appendiceal orifice,                            and rectum were photographed. Scope In: 10:42:05 AM Scope Out: 11:06:17 AM Scope Withdrawal Time: 0 hours 20 minutes 24 seconds  Total Procedure Duration: 0 hours 24 minutes 12 seconds  Findings:      Two sessile polyps were found in the transverse colon and ascending       colon. The polyps were 3 to 5 mm in size. These polyps were removed with       a cold snare. Resection and retrieval were complete.      Non-bleeding internal hemorrhoids were found during retroflexion. The       hemorrhoids were small. Impression:               - Two 3 to 5 mm polyps in the transverse colon and                            in the ascending colon, removed with a cold snare.                            Resected and retrieved.                           - Non-bleeding internal hemorrhoids. Moderate Sedation:      Per Anesthesia Care Recommendation:           - Patient has a contact number available for                            emergencies. The signs and symptoms of potential                            delayed  complications were discussed with the                            patient. Return to normal activities tomorrow.                            Written discharge instructions were provided to the                            patient.                           - Resume previous diet.                           - Continue present medications.                           - Await pathology results.                           - Repeat colonoscopy in 7-10 years for surveillance                            based on pathology results.                           - Return to primary care physician as previously  scheduled. Procedure Code(s):        --- Professional ---                           [phone], Colonoscopy, flexible; with removal of                            tumor(s), polyp(s), or other lesion(s) by snare                            technique Diagnosis Code(s):        --- Professional ---                           D12.3, Benign neoplasm of transverse colon (hepatic                            flexure or splenic flexure)                           D12.2, Benign neoplasm of ascending colon                           K64.8, Other hemorrhoids                           R19.5, Other fecal abnormalities CPT copyright 2022 American Medical Association. All rights reserved. The codes documented in this report are preliminary and upon coder review may  be revised to meet current compliance requirements. Deatrice Dine, MD Deatrice Dine, MD 11/22/2024 11:15:08 AM This report has been signed electronically. Number of Addenda: 0

## 2024-11-22 NOTE — Discharge Instructions (Signed)

## 2024-11-22 NOTE — H&P (Signed)
 Primary Care Physician:  Edman Meade PEDLAR, FNP Primary Gastroenterologist:  Dr. Cinderella  Pre-Procedure History & Physical: HPI:  [name] is a 63 y.o. female with rheumatoid arthritis on Humira  and prednisone , breast cancer COPD and GERD who presents for evaluation of Cologuard test positive, iron deficiency anemia   Patient reports that occasionally she will notice her stools black in color.  She has been on iron at least since 2015 when she was told she has iron deficiency.  Patient occasionally takes NSAIDs (naproxen) The patient denies having any nausea, vomiting, fever, chills,  hematemesis, abdominal distention, abdominal pain, diarrhea, jaundice, pruritus or weight loss.   Last ZHI:wnwz Last Colonoscopy:age 83 at OSH    FHx: Strong family history of breast and ovarian cancer, as per patient genetic testing was done which was negative  social: neg smoking, alcohol or illicit drug use   Labs from 22,025 hemoglobin 12.9 MCV 80 platelet 201   Hemoglobin A1c 5.9   Hep B core IgM reactive hep B surface antigen negative  Past Medical History:  Diagnosis Date   Adenocarcinoma of left breast (HCC)    double mastectomy 1990 then 2002    Anemia    Arthritis    rheumatoid   Asthma    BRCA1 positive, suspected 06/10/2014   Mother is BRCA1 +, sister died at 31 from breast cancer, another sister had breast cancer too.   Breast cancer (HCC)    double mastectomy 1990 then 2002   Chronic acquired lymphedema    COPD (chronic obstructive pulmonary disease) (HCC)    Family history of breast cancer    Family history of ovarian cancer    Family history of pancreatic cancer    High cholesterol    Infiltrating ductal carcinoma of right breast (HCC) 06/10/2014   Personal history of malignant neoplasm of breast    S/P bilateral salpingo-oophorectomy 06/10/2014   August 2010    Past Surgical History:  Procedure Laterality Date   ABDOMINAL HYSTERECTOMY     complete hysterectomy    BREAST SURGERY     double mastectomy     MASTECTOMY      Prior to Admission medications  Medication Sig Start Date End Date Taking? Authorizing Provider  albuterol  (VENTOLIN  HFA) 108 (90 Base) MCG/ACT inhaler USE 2 INHALATIONS BY MOUTH EVERY 4 HOURS AS NEEDED 02/28/24  Yes Bacchus, Meade PEDLAR, FNP  Cyanocobalamin (B-12) 1000 MCG TABS Take by mouth. Patient taking differently: Take by mouth daily at 6 (six) AM.   Yes [provider]  Ferrous Gluconate (IRON 27 PO) Take 65 mg by mouth every other day. Patient taking differently: Take 65 mg by mouth daily at 6 (six) AM.   Yes [provider]  fluticasone (FLONASE) 50 MCG/ACT nasal spray Place into both nostrils. Patient taking differently: Place into both nostrils as needed. 04/14/20  Yes [provider]  furosemide  (LASIX ) 40 MG tablet TAKE 1 TABLET BY MOUTH DAILY 05/21/24  Yes Bacchus, Gloria Z, FNP  hydrochlorothiazide  (HYDRODIURIL ) 25 MG tablet TAKE 1 TABLET BY MOUTH DAILY 08/14/24  Yes Bacchus, Gloria Z, FNP  naproxen sodium (ALEVE) 220 MG tablet Take 220 mg by mouth. Patient taking differently: Take 220 mg by mouth as needed.   Yes [provider]  pantoprazole  (PROTONIX ) 40 MG tablet TAKE 1 TABLET BY MOUTH DAILY 03/11/24  Yes Bacchus, Gloria Z, FNP  pravastatin  (PRAVACHOL ) 40 MG tablet TAKE 1 TABLET BY MOUTH DAILY 11/13/24  Yes Bacchus, Gloria Z, FNP  predniSONE  (  DELTASONE ) 5 MG tablet TAKE 2 TABLETS BY MOUTH DAILY  WITH BREAKFAST 09/24/24  Yes Rice, Lonni ORN, MD  VITAMIN D  PO Take by mouth.   Yes [provider]  adalimumab  (HUMIRA , 2 PEN,) 40 MG/0.4ML pen Inject 0.4 mLs (40 mg total) into the skin every 14 (fourteen) days. 1 kit - 2 pens 10/15/24   Rice, Lonni ORN, MD  albuterol  (ACCUNEB ) 0.63 MG/3ML nebulizer solution Take 1 ampule by nebulization 2 (two) times daily. Patient taking differently: Take 1 ampule by nebulization 2 (two) times daily. As needed.    [provider]   Oxymetazoline HCl (NASAL SPRAY NA) Place into the nose. otc Patient taking differently: Place into the nose as needed. otc    [provider]  RESTASIS  0.05 % ophthalmic emulsion Place 1 drop into both eyes 2 (two) times daily. Patient taking differently: Place 1 drop into both eyes as needed. 01/01/24   Bacchus, Meade PEDLAR, FNP  UNABLE TO FIND 1 each by Does not apply route daily. Med Name: Prosthetic Bra 10/18/24   Edman Meade PEDLAR, FNP    Allergies as of 10/21/2024   (No Known Allergies)    Family History  Problem Relation Age of Onset   Breast cancer Mother        dx < 78   Breast cancer Sister 55   Ovarian cancer Sister 46   Breast cancer Sister        d. 7   Breast cancer Maternal Uncle    Breast cancer Maternal Grandmother        dx < 50   Pancreatic cancer Cousin        mat first cousin: reportedly BRCA1 pos    Social History   Socioeconomic History   Marital status: Married    Spouse name: Not on file   Number of children: Not on file   Years of education: Not on file   Highest education level: 12th grade  Occupational History   Not on file  Tobacco Use   Smoking status: Never    Passive exposure: Never   Smokeless tobacco: Never  Vaping Use   Vaping status: Never Used  Substance and Sexual Activity   Alcohol use: No   Drug use: No   Sexual activity: Yes    Birth control/protection: Surgical  Other Topics Concern   Not on file  Social History Narrative   Not on file   Social Drivers of Health   Tobacco Use: Low Risk (11/18/2024)   Patient History    Smoking Tobacco Use: Never    Smokeless Tobacco Use: Never    Passive Exposure: Never  Financial Resource Strain: Low Risk (04/30/2024)   Overall Financial Resource Strain (CARDIA)    Difficulty of Paying Living Expenses: Not very hard  Food Insecurity: No Food Insecurity (04/30/2024)   Hunger Vital Sign    Worried About Running Out of Food in the Last Year: Never true    Ran Out of Food in  the Last Year: Never true  Transportation Needs: No Transportation Needs (04/30/2024)   PRAPARE - Administrator, Civil Service (Medical): No    Lack of Transportation (Non-Medical): No  Physical Activity: Sufficiently Active (04/30/2024)   Exercise Vital Sign    Days of Exercise per Week: 5 days    Minutes of Exercise per Session: 30 min  Stress: No Stress Concern Present (04/30/2024)   [employer] of Occupational Health - Occupational Stress Questionnaire  Feeling of Stress : Not at all  Social Connections: Unknown (04/30/2024)   Social Connection and Isolation Panel    Frequency of Communication with Friends and Family: Patient declined    Frequency of Social Gatherings with Friends and Family: Patient declined    Attends Religious Services: More than 4 times per year    Active Member of [employer] or Organizations: No    Attends Engineer, Structural: Not on file    Marital Status: Married  Catering Manager Violence: Not on file  Depression (PHQ2-9): Low Risk (09/05/2024)   Depression (PHQ2-9)    PHQ-2 Score: 0  Alcohol Screen: Not on file  Housing: Not on file  Utilities: Not on file  Health Literacy: Not on file    Review of Systems: See HPI, otherwise negative ROS  Physical Exam: Vital signs in last 24 hours: Temp:  [97.7 F (36.5 C)] 97.7 F (36.5 C) (12/26 0854) Pulse Rate:  [64] 64 (12/26 0854) Resp:  [16] 16 (12/26 0854) BP: (129)/(81) 129/81 (12/26 0854) SpO2:  [100 %] 100 % (12/26 0854)   General:   Alert,  Well-developed, well-nourished, pleasant and cooperative in NAD Head:  Normocephalic and atraumatic. Eyes:  Sclera clear, no icterus.   Conjunctiva pink. Ears:  Normal auditory acuity. Nose:  No deformity, discharge,  or lesions. Msk:  Symmetrical without gross deformities. Normal posture. Extremities:  Without clubbing or edema. Neurologic:  Alert and  oriented x4;  grossly normal neurologically. Skin:  Intact without significant  lesions or rashes. Psych:  Alert and cooperative. Normal mood and affect.  Impression/Plan: Proceed with colonoscopy and upper endoscopy   The risks of the procedure including infection, bleed, or perforation as well as benefits, limitations, alternatives and imponderables have been reviewed with the patient. Questions have been answered. All parties agreeable.

## 2024-11-25 ENCOUNTER — Encounter (INDEPENDENT_AMBULATORY_CARE_PROVIDER_SITE_OTHER): Payer: Self-pay | Admitting: *Deleted

## 2024-11-26 ENCOUNTER — Encounter (HOSPITAL_COMMUNITY): Payer: Self-pay | Admitting: Gastroenterology

## 2024-11-26 LAB — SURGICAL PATHOLOGY

## 2024-11-26 NOTE — Telephone Encounter (Signed)
 Patient called the office to check on the status of her Yuflyma due to receiving a call saying that she was denied and was not sure. Spoke with Symonds and he advised that patient was not approved for the 2025 year but can start it in January or once finished with the rest of the Humira  that she already has

## 2024-11-28 ENCOUNTER — Other Ambulatory Visit: Payer: Self-pay | Admitting: Family Medicine

## 2024-11-28 DIAGNOSIS — K219 Gastro-esophageal reflux disease without esophagitis: Secondary | ICD-10-CM

## 2024-11-28 DIAGNOSIS — J449 Chronic obstructive pulmonary disease, unspecified: Secondary | ICD-10-CM

## 2024-11-29 ENCOUNTER — Ambulatory Visit
Admission: EM | Admit: 2024-11-29 | Discharge: 2024-11-29 | Disposition: A | Discharge status: Home / Self Care | Attending: Nurse Practitioner | Admitting: Nurse Practitioner

## 2024-11-29 ENCOUNTER — Ambulatory Visit (INDEPENDENT_AMBULATORY_CARE_PROVIDER_SITE_OTHER): Payer: Self-pay | Admitting: Gastroenterology

## 2024-11-29 ENCOUNTER — Ambulatory Visit: Payer: Self-pay

## 2024-11-29 DIAGNOSIS — A048 Other specified bacterial intestinal infections: Secondary | ICD-10-CM

## 2024-11-29 DIAGNOSIS — L03116 Cellulitis of left lower limb: Secondary | ICD-10-CM | POA: Diagnosis not present

## 2024-11-29 MED ORDER — DOXYCYCLINE HYCLATE 100 MG PO TABS: 100.0000 mg | ORAL_TABLET | Freq: Two times a day (BID) | ORAL | 0 refills | Status: AC

## 2024-11-29 MED ORDER — BISMUTH/METRONIDAZ/TETRACYCLIN 140-125-125 MG PO CAPS: 3.0000 | ORAL_CAPSULE | Freq: Four times a day (QID) | ORAL | 0 refills | Status: DC

## 2024-11-29 MED ORDER — PANTOPRAZOLE SODIUM 40 MG PO TBEC: 40.0000 mg | DELAYED_RELEASE_TABLET | Freq: Two times a day (BID) | ORAL | 3 refills | Status: AC

## 2024-11-29 NOTE — Telephone Encounter (Signed)
 FYI Only or Action Required?: FYI only for provider: urgent care advised.  Patient was last seen in primary care on 09/05/2024 by Edman Meade PEDLAR, FNP.  Called Nurse Triage reporting Leg Swelling.  Symptoms began several days ago.  Interventions attempted: Other: elevated legs; stopped wearing compression stockings.  Symptoms are: gradually worsening.  Triage Disposition: See HCP Within 4 Hours (Or PCP Triage)  Patient/caregiver understands and will follow disposition?: Yes           Copied from CRM #[id number]. Topic: Clinical - Red Word Triage >> Nov 29, 2024 10:02 AM Everette C wrote: [facility] that prompted transfer to Nurse Triage: The patient shares that they have began to notice redness, swelling and visible skin irritation on their left leg. The patient shares that they have a history of cellulitis and lymphedema concerns. Reason for Disposition  [1] Thigh, calf, or ankle swelling AND [2] only 1 side  Answer Assessment - Initial Assessment Questions 1. ONSET: When did the swelling start? (e.g., minutes, hours, days)     Tuesday.  2. LOCATION: What part of the leg is swollen?  Are both legs swollen or just one leg?     Left leg from knee down to foot.  3. SEVERITY: How bad is the swelling? (e.g., localized; mild, moderate, severe)     Moderate.  4. REDNESS: Is there redness or signs of infection?     Yes, small (about fingernail size) red spots on left lower leg.  5. PAIN: Is the swelling painful to touch? If Yes, ask: How painful is it?   (Scale 1-10; mild, moderate or severe)     Yes. 9/10. Elevated legs last night. OTC pain relief med taken last night.  6. FEVER: Do you have a fever? If Yes, ask: What is it, how was it measured, and when did it start?      No, unsure and has not checked.  7. CAUSE: What do you think is causing the leg swelling?     Cellulitis.  8. MEDICAL HISTORY: Do you have a history of blood clots (e.g., DVT), cancer,  heart failure, kidney disease, or liver failure?     Cancer.  9. RECURRENT SYMPTOM: Have you had leg swelling before? If Yes, ask: When was the last time? What happened that time?     Yes. She states she has a history of cellulitis, lymphedema and swelling in her legs.  10. OTHER SYMPTOMS: Do you have any other symptoms? (e.g., chest pain, difficulty breathing)       No chest pain, difficulty breathing, blue discoloration.  Protocols used: Leg Swelling and Edema-A-AH

## 2024-11-29 NOTE — Discharge Instructions (Signed)
 Take medication as prescribed. You may take over-the-counter Tylenol  as needed for pain, fever, or general discomfort. Elevate the left leg above the level of the heart is much as possible to help decrease swelling. Continue use of your compression hose while symptoms persist.  Wear the socks for 12 hours, remove for 12 hours, then repeat. Monitor for worsening.  Go to the emergency department immediately if experience increased redness, swelling, or if you develop new symptoms of fever, chills, or other concerns. Recommend follow-up with your primary care physician within the next 7 to 10 days for reevaluation. Follow-up as needed.

## 2024-11-29 NOTE — ED Provider Notes (Signed)
 " [facility]    CSN: [id number] Arrival date & time: 11/29/24  1103      History   Chief Complaint No chief complaint on file.   HPI [name] is a 64 y.o. female.   The history is provided by the patient.   Patient presents for complaints of redness, swelling, and pain to the left lower leg.  She states symptoms have been present for the past 2 to 3 days.  She states symptoms have now progressed toward her left foot.  She reports prior history of cellulitis in her upper extremity.  She does endorse history of lymphedema.  She states that the left lower leg was hot on yesterday.  She denies I remember and I was like I have to I think I fever, chills, numbness, tingling, radiation of pain, or the inability to bear weight.  States she has been wearing compression socks since her symptoms started.  Past Medical History:  Diagnosis Date   Adenocarcinoma of left breast (HCC)    double mastectomy 1990 then 2002    Anemia    Arthritis    rheumatoid   Asthma    BRCA1 positive, suspected 06/10/2014   Mother is BRCA1 +, sister died at 35 from breast cancer, another sister had breast cancer too.   Breast cancer (HCC)    double mastectomy 1990 then 2002   Chronic acquired lymphedema    COPD (chronic obstructive pulmonary disease) (HCC)    Family history of breast cancer    Family history of ovarian cancer    Family history of pancreatic cancer    High cholesterol    Infiltrating ductal carcinoma of right breast (HCC) 06/10/2014   Personal history of malignant neoplasm of breast    S/P bilateral salpingo-oophorectomy 06/10/2014   August 2010    Patient Active Problem List   Diagnosis Date Noted   Gastritis and gastroduodenitis 11/22/2024   Adenomatous polyp of ascending colon 11/22/2024   Positive colorectal cancer screening using Cologuard test 10/18/2024   Melena 10/18/2024   Hepatitis B core antibody positive 10/18/2024   Hypercalcemia 09/05/2024   Pedal  edema 07/15/2024   Pain in right shoulder 01/04/2024   Osteoporosis screening 01/04/2024   GERD (gastroesophageal reflux disease) 01/01/2024   High risk medication use 11/03/2023   Genetic testing 01/27/2022   Family history of breast cancer 01/06/2022   Family history of ovarian cancer 01/06/2022   Family history of pancreatic cancer 01/06/2022   Personal history of malignant neoplasm of breast 01/06/2022   Infiltrating ductal carcinoma of right breast (HCC) 06/10/2014   BRCA1 positive, suspected 06/10/2014   S/P bilateral salpingo-oophorectomy 06/10/2014   Cellulitis 03/26/2014   Nausea and vomiting 03/26/2014   Cellulitis of left arm 03/26/2014   Lymphedema 03/26/2014   COPD (chronic obstructive pulmonary disease) (HCC)    Rheumatoid arthritis involving multiple sites (HCC)    Anemia    High cholesterol    Adenocarcinoma of left breast (HCC)     Past Surgical History:  Procedure Laterality Date   ABDOMINAL HYSTERECTOMY     complete hysterectomy   BREAST SURGERY     COLONOSCOPY N/A 11/22/2024   Procedure: COLONOSCOPY;  Surgeon: Cinderella Deatrice FALCON, MD;  Location: [facility];  Service: Endoscopy;  Laterality: N/A;  10:15 am, ANY ROOM   double mastectomy     ESOPHAGOGASTRODUODENOSCOPY N/A 11/22/2024   Procedure: EGD (ESOPHAGOGASTRODUODENOSCOPY);  Surgeon: Cinderella Deatrice FALCON, MD;  Location: [facility];  Service: Endoscopy;  Laterality: N/A;   MASTECTOMY     POLYPECTOMY  11/22/2024   Procedure: POLYPECTOMY, INTESTINE;  Surgeon: Cinderella Deatrice FALCON, MD;  Location: [facility];  Service: Endoscopy;;    OB History   No obstetric history on file.      Home Medications    Prior to Admission medications  Medication Sig Start Date End Date Taking? Authorizing Provider  doxycycline (VIBRA-TABS) 100 MG tablet Take 1 tablet (100 mg total) by mouth 2 (two) times daily for 7 days. 11/29/24 12/06/24 Yes Leath-Warren, Etta PARAS, NP  adalimumab  (HUMIRA , 2 PEN,) 40 MG/0.4ML pen Inject  0.4 mLs (40 mg total) into the skin every 14 (fourteen) days. 1 kit - 2 pens 10/15/24   Rice, Lonni ORN, MD  albuterol  (ACCUNEB ) 0.63 MG/3ML nebulizer solution Take 1 ampule by nebulization 2 (two) times daily. Patient taking differently: Take 1 ampule by nebulization 2 (two) times daily. As needed.    [provider]  albuterol  (VENTOLIN  HFA) 108 (90 Base) MCG/ACT inhaler USE 2 INHALATIONS BY MOUTH EVERY 4 HOURS AS NEEDED 11/29/24   Bacchus, Gloria Z, FNP  Bismuth/Metronidaz/Tetracyclin [facility]) 140-125-125 MG CAPS Take 3 capsules by mouth in the morning, at noon, in the evening, and at bedtime. 11/29/24 12/09/24  Ahmed, Muhammad F, MD  Cyanocobalamin (B-12) 1000 MCG TABS Take by mouth. Patient taking differently: Take by mouth daily at 6 (six) AM.    [provider]  Ferrous Gluconate (IRON 27 PO) Take 65 mg by mouth every other day. Patient taking differently: Take 65 mg by mouth daily at 6 (six) AM.    [provider]  fluticasone (FLONASE) 50 MCG/ACT nasal spray Place into both nostrils. Patient taking differently: Place into both nostrils as needed. 04/14/20   [provider]  furosemide  (LASIX ) 40 MG tablet TAKE 1 TABLET BY MOUTH DAILY 05/21/24   Bacchus, Gloria Z, FNP  hydrochlorothiazide  (HYDRODIURIL ) 25 MG tablet TAKE 1 TABLET BY MOUTH DAILY 08/14/24   Bacchus, Gloria Z, FNP  naproxen sodium (ALEVE) 220 MG tablet Take 220 mg by mouth. Patient taking differently: Take 220 mg by mouth as needed.    [provider]  Oxymetazoline HCl (NASAL SPRAY NA) Place into the nose. otc Patient taking differently: Place into the nose as needed. otc    [provider]  pantoprazole  (PROTONIX ) 40 MG tablet TAKE 1 TABLET BY MOUTH DAILY 11/29/24   Bacchus, Gloria Z, FNP  pantoprazole  (PROTONIX ) 40 MG tablet Take 1 tablet (40 mg total) by mouth 2 (two) times daily. 11/29/24 12/09/24  Cinderella Deatrice FALCON, MD  pravastatin  (PRAVACHOL ) 40 MG tablet TAKE 1 TABLET BY  MOUTH DAILY 11/13/24   Bacchus, Gloria Z, FNP  predniSONE  (DELTASONE ) 5 MG tablet TAKE 2 TABLETS BY MOUTH DAILY  WITH BREAKFAST 09/24/24   Rice, Lonni ORN, MD  RESTASIS  0.05 % ophthalmic emulsion Place 1 drop into both eyes 2 (two) times daily. Patient taking differently: Place 1 drop into both eyes as needed. 01/01/24   Bacchus, Meade PEDLAR, FNP  UNABLE TO FIND 1 each by Does not apply route daily. Med Name: Prosthetic Bra 10/18/24   Edman Meade PEDLAR, FNP  VITAMIN D  PO Take by mouth.    [provider]    Family History Family History  Problem Relation Age of Onset   Breast cancer Mother        dx < 54   Breast cancer Sister 30   Ovarian cancer Sister 35   Breast cancer Sister  d. 35   Breast cancer Maternal Uncle    Breast cancer Maternal Grandmother        dx < 50   Pancreatic cancer Cousin        mat first cousin: reportedly BRCA1 pos    Social History Social History[1]   Allergies   Patient has no known allergies.   Review of Systems Review of Systems Per HPI  Physical Exam Triage Vital Signs ED Triage Vitals  Encounter Vitals Group     BP 11/29/24 1119 (!) 145/79     Girls Systolic BP Percentile --      Girls Diastolic BP Percentile --      Boys Systolic BP Percentile --      Boys Diastolic BP Percentile --      Pulse Rate 11/29/24 1119 85     Resp 11/29/24 1119 20     Temp 11/29/24 1119 98.5 F (36.9 C)     Temp Source 11/29/24 1119 Oral     SpO2 11/29/24 1119 96 %     Weight --      Height --      Head Circumference --      Peak Flow --      Pain Score 11/29/24 1117 9     Pain Loc --      Pain Education --      Exclude from Growth Chart --    No data found.  Updated Vital Signs BP (!) 145/79 (BP Location: Right Arm)   Pulse 85   Temp 98.5 F (36.9 C) (Oral)   Resp 20   SpO2 96%   Visual Acuity Right Eye Distance:   Left Eye Distance:   Bilateral Distance:    Right Eye Near:   Left Eye Near:    Bilateral Near:      Physical Exam Vitals and nursing note reviewed.  Constitutional:      General: She is not in acute distress.    Appearance: Normal appearance.  HENT:     Head: Normocephalic.  Eyes:     Extraocular Movements: Extraocular movements intact.     Pupils: Pupils are equal, round, and reactive to light.  Cardiovascular:     Rate and Rhythm: Normal rate and regular rhythm.     Pulses: Normal pulses.     Heart sounds: Normal heart sounds.  Pulmonary:     Effort: Pulmonary effort is normal. No respiratory distress.     Breath sounds: Normal breath sounds. No stridor. No wheezing, rhonchi or rales.  Musculoskeletal:     Cervical back: Normal range of motion.     Left lower leg: Swelling and tenderness present.     Left ankle: Swelling present. Tenderness present.     Left foot: Normal capillary refill. Swelling and tenderness present. Normal pulse.     Comments: +2DP/PT pulses in the left foot. Left lower leg is warm to palpation. Red erythematous patches noted along the left lower extremity in no diffuse pattern. There is no oozing, drainage or fluctuance present.   Skin:    General: Skin is warm and dry.  Neurological:     General: No focal deficit present.     Mental Status: She is alert and oriented to person, place, and time.  Psychiatric:        Mood and Affect: Mood normal.        Behavior: Behavior normal.           UC Treatments / Results  Labs (all labs ordered are listed, but only abnormal results are displayed) Labs Reviewed - No data to display  EKG   Radiology No results found.  Procedures Procedures (including critical care time)  Medications Ordered in UC Medications - No data to display  Initial Impression / Assessment and Plan / UC Course  I have reviewed the triage vital signs and the nursing notes.  Pertinent labs & imaging results that were available during my care of the patient were reviewed by me and considered in my medical decision  making (see chart for details).  Patient presents with a 2 to 3-day history of redness, swelling, and pain to the left lower extremity.  Patient states her symptoms are now extending down into the left foot.  On exam, patient with +2 DP/PT pulses.  She has full range of motion of the left lower extremity.  She does have moderate swelling and erythema in comparison to the right lower extremity.  Symptoms are consistent with cellulitis.  Will treat with doxycycline 100 mg twice daily for the next 7 days.  Supportive care recommendations were provided and discussed with the patient to include continued use of her compression socks, over-the-counter analgesics such as Tylenol  for pain, and to monitor for worsening symptoms.  Patient was given strict ER follow-up precautions, along with recommendations to follow-up with her PCP for reevaluation.  Patient was in agreement with this plan of care and verbalizes understanding.  All questions were answered.  Patient stable for discharge.   Final Clinical Impressions(s) / UC Diagnoses   Final diagnoses:  Cellulitis of left lower extremity     Discharge Instructions      Take medication as prescribed. You may take over-the-counter Tylenol  as needed for pain, fever, or general discomfort. Elevate the left leg above the level of the heart is much as possible to help decrease swelling. Continue use of your compression hose while symptoms persist.  Wear the socks for 12 hours, remove for 12 hours, then repeat. Monitor for worsening.  Go to the emergency department immediately if experience increased redness, swelling, or if you develop new symptoms of fever, chills, or other concerns. Recommend follow-up with your primary care physician within the next 7 to 10 days for reevaluation. Follow-up as needed.     ED Prescriptions     Medication Sig Dispense Auth. Provider   doxycycline (VIBRA-TABS) 100 MG tablet Take 1 tablet (100 mg total) by mouth 2 (two)  times daily for 7 days. 14 tablet Leath-Warren, Etta PARAS, NP      PDMP not reviewed this encounter.    [1]  Social History Tobacco Use   Smoking status: Never    Passive exposure: Never   Smokeless tobacco: Never  Vaping Use   Vaping status: Never Used  Substance Use Topics   Alcohol use: No   Drug use: No     Gilmer Etta PARAS, NP 11/29/24 1151  "

## 2024-11-29 NOTE — ED Triage Notes (Signed)
 Pt reports left leg swelling, redness pain and tenderness, down into the foot started Tuesday denies injury to the affected area.

## 2024-12-02 ENCOUNTER — Ambulatory Visit: Admitting: Physician Assistant

## 2024-12-02 ENCOUNTER — Encounter (INDEPENDENT_AMBULATORY_CARE_PROVIDER_SITE_OTHER): Payer: Self-pay | Admitting: *Deleted

## 2024-12-02 NOTE — Progress Notes (Signed)
 10 yr TCS noted in recall Patient result letter mailed Patient's PCP is on EPIC

## 2024-12-03 ENCOUNTER — Encounter (INDEPENDENT_AMBULATORY_CARE_PROVIDER_SITE_OTHER): Payer: Self-pay

## 2024-12-03 ENCOUNTER — Telehealth (INDEPENDENT_AMBULATORY_CARE_PROVIDER_SITE_OTHER): Payer: Self-pay | Admitting: Gastroenterology

## 2024-12-03 DIAGNOSIS — A048 Other specified bacterial intestinal infections: Secondary | ICD-10-CM

## 2024-12-03 MED ORDER — BISMUTH 262 MG PO CHEW: 2.0000 | CHEWABLE_TABLET | Freq: Four times a day (QID) | ORAL | 0 refills | Status: DC

## 2024-12-03 MED ORDER — METRONIDAZOLE 500 MG PO TABS: 500.0000 mg | ORAL_TABLET | Freq: Three times a day (TID) | ORAL | 0 refills | Status: DC

## 2024-12-03 MED ORDER — PANTOPRAZOLE SODIUM 40 MG PO TBEC: 40.0000 mg | DELAYED_RELEASE_TABLET | Freq: Two times a day (BID) | ORAL | 0 refills | Status: AC

## 2024-12-03 MED ORDER — DOXYCYCLINE MONOHYDRATE 100 MG PO TABS: 100.0000 mg | ORAL_TABLET | Freq: Two times a day (BID) | ORAL | 0 refills | Status: DC

## 2024-12-03 NOTE — Addendum Note (Signed)
 Addended by: CINDERELLA DEATRICE SMILES on: 12/03/2024 04:06 PM   Modules accepted: Orders

## 2024-12-03 NOTE — Telephone Encounter (Signed)
 Patient Name: [name] Patient DOB: Nov 09, 1961 Patient ID: [id number] Status of Request: Deny Medication Name: Bismth/Metr/ [place] Slot GPI/NDC: [id number] Decision Notes: The following medications are covered: (a) Combination of amoxicillin, metronidazole , an antibiotic (that is, clarithromycin, levofloxacin, tetracycline capsule) and a proton pump inhibitor (that is, esomeprazole magnesium DR capsule, lansoprazole, omeprazole, pantoprazole  EC tablet, rabeprazole) as separate products. (b) Voquezna Dual Pak*. Please speak with your provider about your treatment options.

## 2024-12-03 NOTE — Telephone Encounter (Signed)
 PA completed for Bismuth /Metronidaz/Tetracyclin Capsules. Awaiting decision from insurance

## 2024-12-04 NOTE — Telephone Encounter (Signed)
 Prescriber form received - labeled in Onbase. Submission pending return of patient forms

## 2024-12-09 ENCOUNTER — Encounter: Payer: Self-pay | Admitting: Internal Medicine

## 2024-12-09 ENCOUNTER — Telehealth (INDEPENDENT_AMBULATORY_CARE_PROVIDER_SITE_OTHER): Payer: Self-pay | Admitting: Gastroenterology

## 2024-12-09 ENCOUNTER — Other Ambulatory Visit (INDEPENDENT_AMBULATORY_CARE_PROVIDER_SITE_OTHER): Payer: Self-pay

## 2024-12-09 ENCOUNTER — Ambulatory Visit: Admitting: Internal Medicine

## 2024-12-09 VITALS — BP 136/85 | HR 78 | Ht 61.0 in | Wt 185.0 lb

## 2024-12-09 DIAGNOSIS — Z09 Encounter for follow-up examination after completed treatment for conditions other than malignant neoplasm: Secondary | ICD-10-CM | POA: Diagnosis not present

## 2024-12-09 DIAGNOSIS — I890 Lymphedema, not elsewhere classified: Secondary | ICD-10-CM | POA: Diagnosis not present

## 2024-12-09 DIAGNOSIS — R600 Localized edema: Secondary | ICD-10-CM | POA: Diagnosis not present

## 2024-12-09 DIAGNOSIS — A048 Other specified bacterial intestinal infections: Secondary | ICD-10-CM

## 2024-12-09 DIAGNOSIS — L03116 Cellulitis of left lower limb: Secondary | ICD-10-CM | POA: Diagnosis not present

## 2024-12-09 MED ORDER — BISMUTH 262 MG PO CHEW: 2.0000 | CHEWABLE_TABLET | Freq: Four times a day (QID) | ORAL | 0 refills | Status: AC

## 2024-12-09 MED ORDER — DOXYCYCLINE MONOHYDRATE 100 MG PO TABS: 100.0000 mg | ORAL_TABLET | Freq: Two times a day (BID) | ORAL | 0 refills | Status: AC

## 2024-12-09 MED ORDER — FUROSEMIDE 40 MG PO TABS: 40.0000 mg | ORAL_TABLET | Freq: Every day | ORAL | 3 refills | Status: AC | PRN

## 2024-12-09 MED ORDER — METRONIDAZOLE 500 MG PO TABS: 500.0000 mg | ORAL_TABLET | Freq: Three times a day (TID) | ORAL | 0 refills | Status: AC

## 2024-12-09 NOTE — Progress Notes (Signed)
 "  Acute Office Visit  Subjective:    Patient ID: [name], female    DOB: Nov 02, 1961, 64 y.o.   MRN: [id number]  Chief Complaint  Patient presents with   Cellulitis    Left leg swelling concerns having some pain.     HPI Patient is in today for follow-up of recent ER visit.  She went to ER on 11/29/24 for worsening of left leg swelling with redness and warmth locally.  She was told of cellulitis, was given doxycycline .  She has completed doxycycline  and reports improvement in the redness and local warmth.  She has chronic leg swelling bilaterally, for which she has done physical therapy in the past.  She also performs leg elevation and uses compression socks, which improved the leg swelling, but recurs when she has to stand and walk during the daytime.  She also has history of bilateral upper extremity lymphedema, likely due to history of breast cancer.  She denies any fever or chills currently.  She takes HCTZ 25 mg QD and Lasix  40 mg as needed for swelling.  Past Medical History:  Diagnosis Date   Adenocarcinoma of left breast (HCC)    double mastectomy 1990 then 2002    Anemia    Arthritis    rheumatoid   Asthma    BRCA1 positive, suspected 06/10/2014   Mother is BRCA1 +, sister died at 12 from breast cancer, another sister had breast cancer too.   Breast cancer (HCC)    double mastectomy 1990 then 2002   Chronic acquired lymphedema    COPD (chronic obstructive pulmonary disease) (HCC)    Family history of breast cancer    Family history of ovarian cancer    Family history of pancreatic cancer    High cholesterol    Infiltrating ductal carcinoma of right breast (HCC) 06/10/2014   Personal history of malignant neoplasm of breast    S/P bilateral salpingo-oophorectomy 06/10/2014   August 2010    Past Surgical History:  Procedure Laterality Date   ABDOMINAL HYSTERECTOMY     complete hysterectomy   BREAST SURGERY     COLONOSCOPY N/A 11/22/2024   Procedure:  COLONOSCOPY;  Surgeon: Cinderella Deatrice FALCON, MD;  Location: [facility];  Service: Endoscopy;  Laterality: N/A;  10:15 am, ANY ROOM   double mastectomy     ESOPHAGOGASTRODUODENOSCOPY N/A 11/22/2024   Procedure: EGD (ESOPHAGOGASTRODUODENOSCOPY);  Surgeon: Cinderella Deatrice FALCON, MD;  Location: [facility];  Service: Endoscopy;  Laterality: N/A;   MASTECTOMY     POLYPECTOMY  11/22/2024   Procedure: POLYPECTOMY, INTESTINE;  Surgeon: Cinderella Deatrice FALCON, MD;  Location: [facility];  Service: Endoscopy;;    Family History  Problem Relation Age of Onset   Breast cancer Mother        dx < 63   Breast cancer Sister 60   Ovarian cancer Sister 30   Breast cancer Sister        d. 63   Breast cancer Maternal Uncle    Breast cancer Maternal Grandmother        dx < 50   Pancreatic cancer Cousin        mat first cousin: reportedly BRCA1 pos    Social History   Socioeconomic History   Marital status: Married    Spouse name: Not on file   Number of children: Not on file   Years of education: Not on file   Highest education level: 12th grade  Occupational History  Not on file  Tobacco Use   Smoking status: Never    Passive exposure: Never   Smokeless tobacco: Never  Vaping Use   Vaping status: Never Used  Substance and Sexual Activity   Alcohol use: No   Drug use: No   Sexual activity: Yes    Birth control/protection: Surgical  Other Topics Concern   Not on file  Social History Narrative   Not on file   Social Drivers of Health   Tobacco Use: Low Risk (12/09/2024)   Patient History    Smoking Tobacco Use: Never    Smokeless Tobacco Use: Never    Passive Exposure: Never  Financial Resource Strain: Low Risk (04/30/2024)   Overall Financial Resource Strain (CARDIA)    Difficulty of Paying Living Expenses: Not very hard  Food Insecurity: No Food Insecurity (04/30/2024)   Hunger Vital Sign    Worried About Running Out of Food in the Last Year: Never true    Ran Out of Food in the Last  Year: Never true  Transportation Needs: No Transportation Needs (04/30/2024)   PRAPARE - Administrator, Civil Service (Medical): No    Lack of Transportation (Non-Medical): No  Physical Activity: Sufficiently Active (04/30/2024)   Exercise Vital Sign    Days of Exercise per Week: 5 days    Minutes of Exercise per Session: 30 min  Stress: No Stress Concern Present (04/30/2024)   [employer] of Occupational Health - Occupational Stress Questionnaire    Feeling of Stress : Not at all  Social Connections: Unknown (04/30/2024)   Social Connection and Isolation Panel    Frequency of Communication with Friends and Family: Patient declined    Frequency of Social Gatherings with Friends and Family: Patient declined    Attends Religious Services: More than 4 times per year    Active Member of [employer] or Organizations: No    Attends Engineer, Structural: Not on file    Marital Status: Married  Catering Manager Violence: Not on file  Depression (PHQ2-9): Low Risk (12/09/2024)   Depression (PHQ2-9)    PHQ-2 Score: 0  Alcohol Screen: Not on file  Housing: Not on file  Utilities: Not on file  Health Literacy: Not on file    Outpatient Medications Prior to Visit  Medication Sig Dispense Refill   adalimumab  (HUMIRA , 2 PEN,) 40 MG/0.4ML pen Inject 0.4 mLs (40 mg total) into the skin every 14 (fourteen) days. 1 kit - 2 pens 2.4 mL 0   albuterol  (ACCUNEB ) 0.63 MG/3ML nebulizer solution Take 1 ampule by nebulization 2 (two) times daily. (Patient taking differently: Take 1 ampule by nebulization 2 (two) times daily. As needed.)     albuterol  (VENTOLIN  HFA) 108 (90 Base) MCG/ACT inhaler USE 2 INHALATIONS BY MOUTH EVERY 4 HOURS AS NEEDED 108 g 2   Cyanocobalamin (B-12) 1000 MCG TABS Take by mouth. (Patient taking differently: Take by mouth daily at 6 (six) AM.)     Ferrous Gluconate (IRON 27 PO) Take 65 mg by mouth every other day. (Patient taking differently: Take 65 mg by mouth daily at  6 (six) AM.)     fluticasone (FLONASE) 50 MCG/ACT nasal spray Place into both nostrils. (Patient taking differently: Place into both nostrils as needed.)     hydrochlorothiazide  (HYDRODIURIL ) 25 MG tablet TAKE 1 TABLET BY MOUTH DAILY 100 tablet 2   Oxymetazoline HCl (NASAL SPRAY NA) Place into the nose. otc (Patient taking differently: Place into the nose as needed. otc)  pantoprazole  (PROTONIX ) 40 MG tablet TAKE 1 TABLET BY MOUTH DAILY 100 tablet 2   pantoprazole  (PROTONIX ) 40 MG tablet Take 1 tablet (40 mg total) by mouth 2 (two) times daily. 60 tablet 3   pantoprazole  (PROTONIX ) 40 MG tablet Take 1 tablet (40 mg total) by mouth 2 (two) times daily. 28 tablet 0   pravastatin  (PRAVACHOL ) 40 MG tablet TAKE 1 TABLET BY MOUTH DAILY 100 tablet 2   predniSONE  (DELTASONE ) 5 MG tablet TAKE 2 TABLETS BY MOUTH DAILY  WITH BREAKFAST 60 tablet 0   RESTASIS  0.05 % ophthalmic emulsion Place 1 drop into both eyes 2 (two) times daily. (Patient taking differently: Place 1 drop into both eyes as needed.) 0.4 mL 3   UNABLE TO FIND 1 each by Does not apply route daily. Med Name: Prosthetic Bra 1 each 0   VITAMIN D  PO Take by mouth.     Bismuth  262 MG CHEW Chew 2 tablets by mouth in the morning, at noon, in the evening, and at bedtime for 14 days. 112 tablet 0   Bismuth /Metronidaz/Tetracyclin (PYLERA) 140-125-125 MG CAPS Take 3 capsules by mouth in the morning, at noon, in the evening, and at bedtime. 120 capsule 0   doxycycline  (ADOXA) 100 MG tablet Take 1 tablet (100 mg total) by mouth 2 (two) times daily for 14 days. 28 tablet 0   furosemide  (LASIX ) 40 MG tablet TAKE 1 TABLET BY MOUTH DAILY 60 tablet 5   metroNIDAZOLE  (FLAGYL ) 500 MG tablet Take 1 tablet (500 mg total) by mouth 3 (three) times daily for 14 days. 42 tablet 0   naproxen sodium (ALEVE) 220 MG tablet Take 220 mg by mouth. (Patient taking differently: Take 220 mg by mouth as needed.)     No facility-administered medications prior to visit.     Allergies[1]  Review of Systems  Constitutional:  Negative for chills and fever.  HENT:  Negative for congestion, sinus pressure, sinus pain and sore throat.   Eyes:  Negative for pain and discharge.  Respiratory:  Negative for cough and shortness of breath.   Cardiovascular:  Positive for leg swelling. Negative for chest pain and palpitations.  Gastrointestinal:  Negative for abdominal pain, diarrhea, nausea and vomiting.  Endocrine: Negative for polydipsia and polyuria.  Genitourinary:  Negative for dysuria and hematuria.  Musculoskeletal:  Negative for neck pain and neck stiffness.  Skin:  Negative for rash.  Neurological:  Negative for dizziness and weakness.  Psychiatric/Behavioral:  Negative for agitation and behavioral problems.        Objective:    Physical Exam Vitals reviewed.  Constitutional:      General: She is not in acute distress.    Appearance: She is obese. She is not diaphoretic.  HENT:     Head: Normocephalic and atraumatic.     Nose: Nose normal.     Mouth/Throat:     Mouth: Mucous membranes are moist.  Eyes:     General: No scleral icterus.    Extraocular Movements: Extraocular movements intact.  Cardiovascular:     Rate and Rhythm: Normal rate and regular rhythm.     Heart sounds: Normal heart sounds. No murmur heard. Pulmonary:     Breath sounds: Normal breath sounds. No wheezing or rales.  Musculoskeletal:     Cervical back: Neck supple. No tenderness.     Right lower leg: Edema (1+) present.     Left lower leg: Edema (1+) present.     Comments: Bilateral UE lymphedema  Skin:  General: Skin is warm.     Findings: No rash.  Neurological:     General: No focal deficit present.     Mental Status: She is alert and oriented to person, place, and time.  Psychiatric:        Mood and Affect: Mood normal.        Behavior: Behavior normal.     BP 136/85 (BP Location: Right Arm)   Pulse 78   Ht 5' 1 (1.549 m)   Wt 185 lb (83.9 kg)    SpO2 97%   BMI 34.96 kg/m  Wt Readings from Last 3 Encounters:  12/09/24 185 lb (83.9 kg)  11/18/24 184 lb 15.5 oz (83.9 kg)  10/15/24 185 lb (83.9 kg)        Assessment & Plan:   Problem List Items Addressed This Visit       Other   Cellulitis   He has completed doxycycline , cellulitis resolved now      Lymphedema   Chronic bilateral upper extremities swelling, likely due to history of breast carcinoma Has done physical therapy in the past, also has used lymphedema vest without much relief      Relevant Medications   furosemide  (LASIX ) 40 MG tablet   Pedal edema   Chronic bilateral leg swelling Continue HCTZ 25 mg QD Advised to perform leg elevation and use compression socks Refilled Lasix  40 mg, to be used as needed for persistent swelling only       H. pylori infection   Recent EGD showed acute gastritis and H. pylori infection Needs to start bismuth , doxycycline  and metronidazole  with PPI Advised to follow up with GI after completing antibiotic course      Encounter for examination following treatment at hospital - Primary   ER chart reviewed Had LLE cellulitis, has improved with doxycycline  now        Meds ordered this encounter  Medications   furosemide  (LASIX ) 40 MG tablet    Sig: Take 1 tablet (40 mg total) by mouth daily as needed.    Dispense:  90 tablet    Refill:  3    Requesting 1 year supply     Suzzane MARLA Blanch, MD     [1] No Known Allergies  "

## 2024-12-09 NOTE — Assessment & Plan Note (Signed)
 ER chart reviewed Had LLE cellulitis, has improved with doxycycline  now

## 2024-12-09 NOTE — Assessment & Plan Note (Signed)
 Chronic bilateral upper extremities swelling, likely due to history of breast carcinoma Has done physical therapy in the past, also has used lymphedema vest without much relief

## 2024-12-09 NOTE — Assessment & Plan Note (Signed)
 Recent EGD showed acute gastritis and H. pylori infection Needs to start bismuth , doxycycline  and metronidazole  with PPI Advised to follow up with GI after completing antibiotic course

## 2024-12-09 NOTE — Assessment & Plan Note (Signed)
 He has completed doxycycline , cellulitis resolved now

## 2024-12-09 NOTE — Patient Instructions (Addendum)
 Please take Lasix  as needed for persistent leg swelling.  Please perform leg elevation and use compression socks as tolerated.  Please continue to follow low salt diet.

## 2024-12-09 NOTE — Assessment & Plan Note (Addendum)
 Chronic bilateral leg swelling Continue HCTZ 25 mg QD Advised to perform leg elevation and use compression socks Refilled Lasix  40 mg, to be used as needed for persistent swelling only

## 2024-12-09 NOTE — Telephone Encounter (Signed)
 Pt came by office and showed up result letter from procedure and states medication is not at pharmacy. Contacted [facility] [place] and they advised medication is ready for pick up. Went out to lobby and went over medications and directions with patient. Advised her that medication was ready at [facility] in [place], pt states that she uses [employer] [address]. Resent medications (Doxy, Metronidazole  and Bismuth ) to [facility] Dr. Almeta has been on Protonix  BID already. [facility] confirmed receipt of medication per EPIC. Pt is aware

## 2024-12-10 NOTE — Telephone Encounter (Signed)
 Pt returned call. Pt states she was told by pharmacy that she would not be able to pick her meds up until 12/14/24-stated it was to soon. Advised pt I would call [facility] to see what was going on. [facility] states that it is too soon and insurance is not going to cover it, probably because it had been sent to [facility]. Pharmacy advise that patient would need to call [facility] and ask them to put the medication back so [facility] could fill it. Pt contacted and states she will pick the medication up from [facility].

## 2024-12-10 NOTE — Telephone Encounter (Signed)
 Pt left voicemail asking for a call back from Dr.Ahmed nurse. Returned call to patient [phone]) but had to leave voicemail to return call or send my chart message.

## 2024-12-12 ENCOUNTER — Telehealth: Payer: Self-pay

## 2024-12-12 NOTE — Telephone Encounter (Signed)
 Submitted Patient Assistance Application to [employer] for [facility] along with patient portion, provider portion, insurance card copy, prior authorization approval, and current medication list. Will update patient when we receive a response.  Phone#: [phone] Fax#: [phone]

## 2024-12-13 ENCOUNTER — Telehealth (INDEPENDENT_AMBULATORY_CARE_PROVIDER_SITE_OTHER): Payer: Self-pay | Admitting: Gastroenterology

## 2024-12-13 NOTE — Telephone Encounter (Signed)
 Pt contacted and verbalized understanding.

## 2024-12-13 NOTE — Telephone Encounter (Signed)
 Pt called into office and states that she began to have cramps and watery mouth after taking doxycycline , metronidazole , bismuth  and pantoprazole  and Methotrexate . States she was bent over with cramps. Pt states she took her Methotrexate  shot and the about 2 hours afterwards she took her antibiotics/bismuth /pantoprazole . Pt states she began to feel better overnight. Please advise. Thank you!

## 2024-12-16 ENCOUNTER — Telehealth (INDEPENDENT_AMBULATORY_CARE_PROVIDER_SITE_OTHER): Payer: Self-pay | Admitting: Gastroenterology

## 2024-12-16 NOTE — Telephone Encounter (Signed)
 Pt left voicemail asking for a call back Returned call to patient. Pt states the doxycycline  and metronidazole  is still causing her to cramp. Pt states she is eating with medication. Medication also makes her feel sleepy. Pt is wondering if she is to take the entire course of medication or does she need to be seen in office. Please advise. Thank you.

## 2024-12-20 ENCOUNTER — Ambulatory Visit

## 2024-12-31 ENCOUNTER — Other Ambulatory Visit (HOSPITAL_COMMUNITY): Payer: Self-pay

## 2024-12-31 NOTE — Telephone Encounter (Signed)
 Reached out to [employer] to determine status and rep advised me that PAP for branded Yuflyma has been discontinued, however PAP still exists for generic adalimumab -aaty. Working to transfer over information to the new application, however the initial income amount that was documented was only for the pt and did not include her husband's income as well. She is going to get that information back to me and requested I message her my phone number through MyChart.

## 2025-01-01 NOTE — Telephone Encounter (Signed)
 Submitted Patient Assistance Application to [employer] for ADALIMUMAB -AATY along with patient portion, provider portion, insurance card copy, prior authorization approval, and current medication list. Will update patient when we receive a response.  Phone#: [phone] Fax#: [phone]

## 2025-01-02 NOTE — Assessment & Plan Note (Signed)
 [name]

## 2025-01-02 NOTE — Assessment & Plan Note (Signed)
 [name]

## 2025-01-02 NOTE — Progress Notes (Unsigned)
 "  Office Visit Note  Patient: [name]             Date of Birth: 06-01-1961           MRN: [id number]             PCP: Edman Meade PEDLAR, FNP Referring: Edman Meade PEDLAR, FNP Visit Date: 01/15/2025   Subjective:  No chief complaint on file.   History of Present Illness: [name] is a 64 y.o. female here for follow up with rheumatoid arthritis on Humira  40 mg every 14 days.    Previous HPI 10/15/2024 [name] is a 64 y.o. female here for follow up with rheumatoid arthritis on Humira  40 mg every 14 days. She has decreased her prednisone  use just as needed, estimating about once weekly.   She experiences ongoing neck pain and stiffness, with an inability to turn her neck fully, particularly when driving. The pain is localized to the neck without radiation. Morning stiffness affects her feet and causes soreness in her back.   She has not experienced any issues with her injections. Recent blood work showed improvement, with her previously low white blood cell count returning to normal. She was advised to resume B12 and vitamin D  supplements due to low levels.   She describes a sensation of tightness and restriction across her chest. Her arms feel heavy. She has been attempting to improve her posture by using pillows for support and performing exercises recommended during her hand therapy.   No recent illnesses but reports persistent swelling. She experiences stiffness in the morning, particularly affecting her feet and back.       Previous HPI 07/15/2024 [name] is a 64 y.o. female here for follow up with rheumatoid arthritis on Humira  40 mg every 14 days and prednisone  10 mg daily.     She has been receiving [employer] injections since June and reports she has not had a real bad flare up like she was having before. She uses prednisone  as needed, approximately two days at a time, three times a month, and notes that most days her symptoms are manageable without  medication.   In July, her lab results showed a decreased white blood cell count, specifically neutrophils, at 1200. Her calcium levels have been consistently high, with a recent measurement of 11.6. She has reduced her calcium intake but is unsure why the levels remain elevated. She denies taking vitamin D  or calcium supplements currently.   She experiences swelling in her legs, which she attributes to lymphedema, and wonders if it could be related to Humira , as she read that it can cause swelling. Elevating her legs at night reduces the swelling by morning, though she experiences numbness when sitting with her legs elevated for extended periods.   She is currently taking iron supplements, initially prescribed by another doctor, but is unsure if she still needs them as her blood count is fine. She has not been sick since her last visit.         Previous HPI 04/02/2024 [name] is a 64 y.o. female here for follow up with rheumatoid arthritis on prednisone  10 mg daily.     She has a history of rheumatoid arthritis and was previously on methotrexate , which was discontinued in February due to elevated liver enzymes. Her liver enzymes returned to normal levels by March. Since stopping methotrexate , she has been managing her rheumatoid arthritis with prednisone , initially at 5 mg, but increased to  10 mg daily in March due to worsening symptoms.   In February, she experienced significant joint pain, particularly in her hands and shoulders, describing it as severe enough that she 'couldn't close my hands' and 'couldn't raise my arm.' By April, her hand pain had improved but was still present.   Her joint pain worsened after stopping methotrexate , but she has had similar flare-ups in the past.   She had a new bone density test that looked good.  Bone density was in osteopenic range at her lowest areas in the total femur and wrist and in the normal range at the vertebra.  Total FRAX was low risk  after accounting for RA and glucocorticoid use.     Previous HPI 01/04/2024 [name] is a 64 year old female with rheumatoid arthritis on methotrexate  15 mg PO weekly and folic acid 1 mg daily and prednisone  5 mg daily. She presents with concerns about medication side effects and elevated calcium levels.   She is currently managed with methotrexate  and low-dose prednisone  for rheumatoid arthritis. She is concerned about recent blood tests showing slightly abnormal liver enzyme levels, which she worries might be due to methotrexate , known to affect the liver.   Recent blood tests revealed elevated calcium levels at 11.3 mg/dL, above the normal range. She is unsure of the cause but mentions taking a vitamin D  supplement that may contain calcium. She has not experienced kidney stones recently, although she had one in the past.   Regarding her rheumatoid arthritis, she has antibodies for the condition, but her inflammation markers were stable. She experiences occasional swelling in her fingers, specifically the left second and third PIP joints, which have decreased since last week. She also reports intermittent pain and stiffness in her right shoulder, particularly when raising her arm, which she attributes to calcification seen on her x-rays. Her left shoulder appears normal on x-rays, with no significant osteoarthritis or joint damage noted.      Previous HPI 11/03/23 [name] is a 64 y.o. female here to establish care for rheumatoid arthritis. She has been on methotrexate  20 mg PO weekly and prednisone  5 mg daily managed through her PCP office but with persistent symptoms involving both hands and has been unable to taper or discontinue steroid use. The patient has been experiencing these symptoms for over a decade, with the onset around 2009-2013. The pain initially presented in the legs, impairing mobility, and later spread to the hands, causing difficulty in closing them, and the  shoulders, limiting arm movement. The patient also reported difficulty opening their mouth due to jaw pain. She was evaluated apparently by exam and xrays indicated rheumatoid arthritis. She started on treatment initially with methotrexate  and with intermittent prednisone  tapers. The patient has been on methotrexate  for approximately ten years. She also takes aleve intermittently for pain as needed.   In October, the patient experienced a severe flare-up, with significant hand swelling and pain that lasted for about a week and a half. This flare-up also affected the patient's shoulders and arms, causing them to become hot and swollen. The patient also reported a burning, tingling sensation in the arms lasting for several hours.   The patient has a history of breast cancer treated with chemotherapy, radiation, and mastectomy, which has led to numbness in the tips of the fingers and toes. The patient also has lymphedema, which has caused swelling in the arms. The patient manages this with a body suit for compression and circulation.  No Rheumatology ROS completed.   PMFS History:  Patient Active Problem List   Diagnosis Date Noted   Encounter for examination following treatment at hospital 12/09/2024   H. pylori infection 12/03/2024   Gastritis and gastroduodenitis 11/22/2024   Adenomatous polyp of ascending colon 11/22/2024   Positive colorectal cancer screening using Cologuard test 10/18/2024   Melena 10/18/2024   Hepatitis B core antibody positive 10/18/2024   Hypercalcemia 09/05/2024   Pedal edema 07/15/2024   Pain in right shoulder 01/04/2024   Osteoporosis screening 01/04/2024   GERD (gastroesophageal reflux disease) 01/01/2024   High risk medication use 11/03/2023   Genetic testing 01/27/2022   Family history of breast cancer 01/06/2022   Family history of ovarian cancer 01/06/2022   Family history of pancreatic cancer 01/06/2022   Personal history of malignant neoplasm of breast  01/06/2022   Infiltrating ductal carcinoma of right breast (HCC) 06/10/2014   BRCA1 positive, suspected 06/10/2014   S/P bilateral salpingo-oophorectomy 06/10/2014   Cellulitis 03/26/2014   Nausea and vomiting 03/26/2014   Cellulitis of left arm 03/26/2014   Lymphedema 03/26/2014   COPD (chronic obstructive pulmonary disease) (HCC)    Rheumatoid arthritis involving multiple sites (HCC)    Anemia    High cholesterol    Adenocarcinoma of left breast (HCC)     Past Medical History:  Diagnosis Date   Adenocarcinoma of left breast (HCC)    double mastectomy 1990 then 2002    Anemia    Arthritis    rheumatoid   Asthma    BRCA1 positive, suspected 06/10/2014   Mother is BRCA1 +, sister died at 26 from breast cancer, another sister had breast cancer too.   Breast cancer (HCC)    double mastectomy 1990 then 2002   Chronic acquired lymphedema    COPD (chronic obstructive pulmonary disease) (HCC)    Family history of breast cancer    Family history of ovarian cancer    Family history of pancreatic cancer    High cholesterol    Infiltrating ductal carcinoma of right breast (HCC) 06/10/2014   Personal history of malignant neoplasm of breast    S/P bilateral salpingo-oophorectomy 06/10/2014   August 2010    Family History  Problem Relation Age of Onset   Breast cancer Mother        dx < 26   Breast cancer Sister 27   Ovarian cancer Sister 66   Breast cancer Sister        d. 66   Breast cancer Maternal Uncle    Breast cancer Maternal Grandmother        dx < 50   Pancreatic cancer Cousin        mat first cousin: reportedly BRCA1 pos   Past Surgical History:  Procedure Laterality Date   ABDOMINAL HYSTERECTOMY     complete hysterectomy   BREAST SURGERY     COLONOSCOPY N/A 11/22/2024   Procedure: COLONOSCOPY;  Surgeon: Cinderella Deatrice FALCON, MD;  Location: [facility];  Service: Endoscopy;  Laterality: N/A;  10:15 am, ANY ROOM   double mastectomy     ESOPHAGOGASTRODUODENOSCOPY  N/A 11/22/2024   Procedure: EGD (ESOPHAGOGASTRODUODENOSCOPY);  Surgeon: Cinderella Deatrice FALCON, MD;  Location: [facility];  Service: Endoscopy;  Laterality: N/A;   MASTECTOMY     POLYPECTOMY  11/22/2024   Procedure: POLYPECTOMY, INTESTINE;  Surgeon: Cinderella Deatrice FALCON, MD;  Location: [facility];  Service: Endoscopy;;   Social History   Social History Narrative   Not on  file   Immunization History  Administered Date(s) Administered   Zoster Recombinant(Shingrix) 04/08/2022, 10/04/2022     Objective: Vital Signs: There were no vitals taken for this visit.   Physical Exam   Musculoskeletal Exam: ***   Investigation: No additional findings.  Imaging: No results found.  Recent Labs: Lab Results  Component Value Date   WBC 4.3 09/05/2024   HGB 12.9 09/05/2024   PLT 201 09/05/2024   NA 141 09/05/2024   K 4.5 09/05/2024   CL 106 09/05/2024   CO2 22 09/05/2024   GLUCOSE 93 09/05/2024   BUN 11 09/05/2024   CREATININE 0.66 09/05/2024   BILITOT 0.6 09/05/2024   ALKPHOS 125 09/05/2024   AST 32 09/05/2024   ALT 38 (H) 09/05/2024   PROT 6.9 09/05/2024   ALBUMIN 3.9 09/05/2024   CALCIUM 10.8 (H) 09/05/2024   GFRAA >60 07/22/2020   QFTBGOLDPLUS NEGATIVE 11/03/2023    Speciality Comments: No specialty comments available.  Procedures:  No procedures performed Allergies: Patient has no known allergies.   Assessment / Plan:     Visit Diagnoses:  Assessment & Plan Rheumatoid arthritis involving multiple sites, unspecified whether rheumatoid factor present (HCC)     High risk medication use      ***  Follow-Up Instructions: No follow-ups on file.   Anushree Dorsi M Rendon Howell, CMA  Note - This record has been created using [employer].  Chart creation errors have been sought, but may not always  have been located. Such creation errors do not reflect on  the standard of medical care. "

## 2025-01-06 ENCOUNTER — Ambulatory Visit: Admitting: Family Medicine

## 2025-01-15 ENCOUNTER — Ambulatory Visit: Admitting: Internal Medicine

## 2025-01-15 DIAGNOSIS — Z79899 Other long term (current) drug therapy: Secondary | ICD-10-CM

## 2025-01-15 DIAGNOSIS — M069 Rheumatoid arthritis, unspecified: Secondary | ICD-10-CM

## 2025-01-17 ENCOUNTER — Ambulatory Visit

## 2025-07-30 ENCOUNTER — Ambulatory Visit: Admitting: Physician Assistant
